# Patient Record
Sex: Female | Born: 1955
Health system: Southern US, Community
[De-identification: ages and names within clinical notes are randomized; demographics above are authoritative.]

## PROBLEM LIST (undated history)

## (undated) DIAGNOSIS — I011 Acute rheumatic endocarditis: Secondary | ICD-10-CM

## (undated) DIAGNOSIS — I2510 Atherosclerotic heart disease of native coronary artery without angina pectoris: Secondary | ICD-10-CM

## (undated) DIAGNOSIS — D649 Anemia, unspecified: Secondary | ICD-10-CM

## (undated) DIAGNOSIS — I10 Essential (primary) hypertension: Secondary | ICD-10-CM

## (undated) DIAGNOSIS — K219 Gastro-esophageal reflux disease without esophagitis: Secondary | ICD-10-CM

## (undated) DIAGNOSIS — K5730 Diverticulosis of large intestine without perforation or abscess without bleeding: Secondary | ICD-10-CM

## (undated) DIAGNOSIS — M7500 Adhesive capsulitis of unspecified shoulder: Secondary | ICD-10-CM

## (undated) DIAGNOSIS — I214 Non-ST elevation (NSTEMI) myocardial infarction: Secondary | ICD-10-CM

## (undated) DIAGNOSIS — E119 Type 2 diabetes mellitus without complications: Secondary | ICD-10-CM

## (undated) HISTORY — PX: WISDOM TOOTH EXTRACTION: SHX21

## (undated) HISTORY — PX: DILATION AND CURETTAGE OF UTERUS: SHX78

## (undated) HISTORY — DX: Type 2 diabetes mellitus without complications: E11.9

## (undated) HISTORY — DX: Gastro-esophageal reflux disease without esophagitis: K21.9

## (undated) HISTORY — DX: Acute rheumatic endocarditis: I01.1

## (undated) HISTORY — DX: Atherosclerotic heart disease of native coronary artery without angina pectoris: I25.10

## (undated) HISTORY — DX: Anemia, unspecified: D64.9

## (undated) HISTORY — DX: Adhesive capsulitis of unspecified shoulder: M75.00

## (undated) HISTORY — PX: SHOULDER SURGERY: SHX246

## (undated) HISTORY — DX: Essential (primary) hypertension: I10

## (undated) HISTORY — DX: Diverticulosis of large intestine without perforation or abscess without bleeding: K57.30

## (undated) HISTORY — DX: Non-ST elevation (NSTEMI) myocardial infarction: I21.4

---

## 2006-05-25 ENCOUNTER — Ambulatory Visit (HOSPITAL_BASED_OUTPATIENT_CLINIC_OR_DEPARTMENT_OTHER): Admission: RE | Admit: 2006-05-25 | Discharge: 2006-05-25 | Payer: Self-pay | Admitting: Orthopedic Surgery

## 2011-09-02 ENCOUNTER — Encounter: Payer: Self-pay | Admitting: Internal Medicine

## 2011-10-21 ENCOUNTER — Encounter: Payer: Self-pay | Admitting: Internal Medicine

## 2011-11-24 ENCOUNTER — Ambulatory Visit (AMBULATORY_SURGERY_CENTER): Payer: 59

## 2011-11-24 VITALS — Ht 68.0 in | Wt 162.3 lb

## 2011-11-24 DIAGNOSIS — Z1211 Encounter for screening for malignant neoplasm of colon: Secondary | ICD-10-CM

## 2011-11-24 MED ORDER — PEG-KCL-NACL-NASULF-NA ASC-C 100 G PO SOLR: 1.0000 | Freq: Once | ORAL | Status: AC

## 2011-11-25 ENCOUNTER — Encounter: Payer: Self-pay | Admitting: Internal Medicine

## 2011-12-08 ENCOUNTER — Encounter: Payer: Self-pay | Admitting: Internal Medicine

## 2011-12-08 ENCOUNTER — Ambulatory Visit (AMBULATORY_SURGERY_CENTER): Payer: 59 | Admitting: Internal Medicine

## 2011-12-08 VITALS — BP 116/77 | HR 82 | Temp 96.8°F | Resp 16 | Ht 68.0 in | Wt 162.0 lb

## 2011-12-08 DIAGNOSIS — Z1211 Encounter for screening for malignant neoplasm of colon: Secondary | ICD-10-CM

## 2011-12-08 DIAGNOSIS — K5730 Diverticulosis of large intestine without perforation or abscess without bleeding: Secondary | ICD-10-CM

## 2011-12-08 LAB — GLUCOSE, CAPILLARY: Glucose-Capillary: 196 mg/dL — ABNORMAL HIGH (ref 70–99)

## 2011-12-08 MED ORDER — SODIUM CHLORIDE 0.9 % IV SOLN: 500.0000 mL | INTRAVENOUS | Status: DC

## 2011-12-08 NOTE — Progress Notes (Signed)
Patient did not experience any of the following events: a burn prior to discharge; a fall within the facility; wrong site/side/patient/procedure/implant event; or a hospital transfer or hospital admission upon discharge from the facility. (G8907) Patient did not have preoperative order for IV antibiotic SSI prophylaxis. (G8918)  

## 2011-12-08 NOTE — Op Note (Signed)
[facility] [address] [place], [place]  [address]  COLONOSCOPY PROCEDURE REPORT  PATIENT:  Rhonda, Allen  MR#:  [id number] BIRTHDATE:  31-Oct-1956, 55 yrs. old  GENDER:  female ENDOSCOPIST:  Wilhemina Bonito. Eda Keys, MD REF. BY:  Screening Recall PROCEDURE DATE:  12/08/2011 PROCEDURE:  Average-risk screening colonoscopy G0121 ASA CLASS:  Class II INDICATIONS:  colorectal cancer screening, average risk ; index exam 08-2004 w/ diverticulosis MEDICATIONS:   Fentanyl 75 mcg IV, Versed 7 mg IV, These medications were titrated to patient response per physician's verbal order  DESCRIPTION OF PROCEDURE:   After the risks benefits and alternatives of the procedure were thoroughly explained, informed consent was obtained.  Digital rectal exam was performed and revealed no abnormalities.   The LB 180AL [id number] endoscope was introduced through the anus and advanced to the cecum, which was identified by both the appendix and ileocecal valve, without limitations.  The quality of the prep was excellent, using MoviPrep.  The instrument was then slowly withdrawn as the colon was fully examined. <<PROCEDUREIMAGES>>  FINDINGS:  Moderate diverticulosis was found in the sigmoid colon. Otherwise normal colonoscopy without polyps, masses, vascular ectasias, or inflammatory changes.   Retroflexed views in the rectum revealed internal hemorrhoids.    The time to cecum = 3:40 minutes. The scope was then withdrawn in 9:32  minutes from the cecum and the procedure completed.  COMPLICATIONS:  None  ENDOSCOPIC IMPRESSION: 1) Moderate diverticulosis in the sigmoid colon 2) Otherwise normal colonoscopy 3) Internal hemorrhoids  RECOMMENDATIONS: 1) Continue current colorectal screening recommendations for "routine risk" patients with a repeat colonoscopy in 10 years.  ______________________________ Wilhemina Bonito. Eda Keys, MD  CC:  Creola Corn, MD;  The Patient  n. eSIGNED:   Wilhemina Bonito. Eda Keys  at 12/08/2011 08:40 AM  Mickel Duhamel, [phone]

## 2011-12-09 ENCOUNTER — Telehealth: Payer: Self-pay | Admitting: *Deleted

## 2011-12-09 NOTE — Telephone Encounter (Signed)

## 2014-10-15 ENCOUNTER — Encounter: Payer: Self-pay | Admitting: Internal Medicine

## 2014-12-18 ENCOUNTER — Encounter: Payer: Self-pay | Admitting: Internal Medicine

## 2014-12-18 ENCOUNTER — Ambulatory Visit (INDEPENDENT_AMBULATORY_CARE_PROVIDER_SITE_OTHER): Payer: 59 | Admitting: Internal Medicine

## 2014-12-18 VITALS — BP 110/60 | HR 89 | Ht 68.5 in | Wt 153.0 lb

## 2014-12-18 DIAGNOSIS — K2100 Gastro-esophageal reflux disease with esophagitis, without bleeding: Secondary | ICD-10-CM

## 2014-12-18 DIAGNOSIS — K5730 Diverticulosis of large intestine without perforation or abscess without bleeding: Secondary | ICD-10-CM

## 2014-12-18 DIAGNOSIS — K222 Esophageal obstruction: Secondary | ICD-10-CM

## 2014-12-18 MED ORDER — OMEPRAZOLE 40 MG PO CPDR: 40.0000 mg | DELAYED_RELEASE_CAPSULE | Freq: Every day | ORAL | Status: DC

## 2014-12-18 NOTE — Patient Instructions (Signed)
We have sent the following medications to your pharmacy for you to pick up at your convenience:  Omeprazole  

## 2014-12-18 NOTE — Progress Notes (Signed)
HISTORY OF PRESENT ILLNESS:  Rhonda Allen is a 58 y.o. female with past medical history as listed below. She is followed in this office for chronic GERD and surveillance colonoscopy. She was last evaluated December 2012 for routine screening colonoscopy. Index exam in 2005 was negative except for diverticulosis. Follow-up examination in 2012 again revealed moderate sigmoid diverticulosis and internal hemorrhoids. No polyps. Follow-up in 10 years recommended. Her index upper endoscopy was performed September 2005 to evaluate iron deficiency anemia. She was found to have mild reflux esophagitis, peptic stricture, antral erosions, and a hiatal hernia. At that time she was placed on omeprazole 20 mg daily. She has done well without reflux symptoms until about 3 months ago when she began to develop breakthrough pyrosis. She takes 20 mg of omeprazole each morning about 15 minutes before the first meal. She denies change in weight, dysphagia, or abdominal pain. GI review of systems is otherwise negative  REVIEW OF SYSTEMS:  All non-GI ROS negative except for joint aches  Past Medical History  Diagnosis Date  . Diabetes mellitus   . Hypertension   . GERD (gastroesophageal reflux disease)   . Chronic kidney disease   . Anemia   . Frozen shoulder     right  . Diverticulosis of colon   . Hemorrhoids     Past Surgical History  Procedure Laterality Date  . Shoulder surgery      bone spur left shoulder  . Wisdom tooth extraction    . Dilation and curettage of uterus      Social History Rhonda Allen  reports that she has never smoked. She has never used smokeless tobacco. She reports that she does not drink alcohol or use illicit drugs.  family history includes Irritable bowel syndrome in her mother.  No Known Allergies     PHYSICAL EXAMINATION: Vital signs: BP 110/60 mmHg  Pulse 89  Ht 5' 8.5" (1.74 m)  Wt 153 lb (69.4 kg)  BMI 22.92 kg/m2  SpO2 87% General:  Well-developed, well-nourished, no acute distress HEENT: Sclerae are anicteric, conjunctiva pink. Oral mucosa intact Lungs: Clear Heart: Regular Abdomen: soft, nontender, nondistended, no obvious ascites, no peritoneal signs, normal bowel sounds. No organomegaly. Extremities: No edema Psychiatric: alert and oriented x3. Cooperative     ASSESSMENT:  #1. GERD. Experiencing breakthrough symptoms on omeprazole 20 mg daily. No alarm features. Previous upper endoscopy in 2005 as described #2. Previous screening colonoscopies negative for neoplasia  PLAN:  #1. Reflux precautions #2. Change omeprazole to 40 mg daily. Prescription submitted electronically with multiple refills #3. Repeat screening colonoscopy around 2022 #4. Routine GI follow-up in 1 year. Sooner if needed for persistent or other relevant symptoms

## 2015-07-22 ENCOUNTER — Encounter: Payer: Self-pay | Admitting: Internal Medicine

## 2015-12-25 ENCOUNTER — Other Ambulatory Visit: Payer: Self-pay | Admitting: Internal Medicine

## 2015-12-29 DIAGNOSIS — I214 Non-ST elevation (NSTEMI) myocardial infarction: Secondary | ICD-10-CM

## 2015-12-29 HISTORY — DX: Non-ST elevation (NSTEMI) myocardial infarction: I21.4

## 2016-03-12 ENCOUNTER — Other Ambulatory Visit: Payer: Self-pay | Admitting: Internal Medicine

## 2016-08-14 ENCOUNTER — Inpatient Hospital Stay (HOSPITAL_COMMUNITY)
Admission: EM | Admit: 2016-08-14 | Discharge: 2016-08-18 | DRG: 247 | Disposition: A | Discharge status: Home / Self Care | Payer: 59 | Attending: Cardiology | Admitting: Cardiology

## 2016-08-14 ENCOUNTER — Encounter (HOSPITAL_COMMUNITY): Payer: Self-pay

## 2016-08-14 ENCOUNTER — Emergency Department (HOSPITAL_COMMUNITY): Payer: 59

## 2016-08-14 DIAGNOSIS — Z7982 Long term (current) use of aspirin: Secondary | ICD-10-CM | POA: Diagnosis not present

## 2016-08-14 DIAGNOSIS — E119 Type 2 diabetes mellitus without complications: Secondary | ICD-10-CM

## 2016-08-14 DIAGNOSIS — R079 Chest pain, unspecified: Secondary | ICD-10-CM | POA: Diagnosis present

## 2016-08-14 DIAGNOSIS — I214 Non-ST elevation (NSTEMI) myocardial infarction: Secondary | ICD-10-CM | POA: Diagnosis not present

## 2016-08-14 DIAGNOSIS — Z79899 Other long term (current) drug therapy: Secondary | ICD-10-CM | POA: Diagnosis not present

## 2016-08-14 DIAGNOSIS — I255 Ischemic cardiomyopathy: Secondary | ICD-10-CM | POA: Diagnosis not present

## 2016-08-14 DIAGNOSIS — K219 Gastro-esophageal reflux disease without esophagitis: Secondary | ICD-10-CM | POA: Diagnosis present

## 2016-08-14 DIAGNOSIS — Z91048 Other nonmedicinal substance allergy status: Secondary | ICD-10-CM | POA: Diagnosis not present

## 2016-08-14 DIAGNOSIS — I10 Essential (primary) hypertension: Secondary | ICD-10-CM | POA: Diagnosis present

## 2016-08-14 DIAGNOSIS — Z794 Long term (current) use of insulin: Secondary | ICD-10-CM

## 2016-08-14 DIAGNOSIS — E785 Hyperlipidemia, unspecified: Secondary | ICD-10-CM | POA: Diagnosis present

## 2016-08-14 DIAGNOSIS — I2510 Atherosclerotic heart disease of native coronary artery without angina pectoris: Secondary | ICD-10-CM | POA: Diagnosis present

## 2016-08-14 LAB — COMPREHENSIVE METABOLIC PANEL
ALBUMIN: 4.1 g/dL (ref 3.5–5.0)
ALT: 23 U/L (ref 14–54)
ANION GAP: 8 (ref 5–15)
AST: 31 U/L (ref 15–41)
Alkaline Phosphatase: 68 U/L (ref 38–126)
BUN: 16 mg/dL (ref 6–20)
CHLORIDE: 104 mmol/L (ref 101–111)
CO2: 28 mmol/L (ref 22–32)
Calcium: 9.1 mg/dL (ref 8.9–10.3)
Creatinine, Ser: 0.86 mg/dL (ref 0.44–1.00)
GFR calc non Af Amer: >60 mL/min (ref >60)
GLUCOSE: 88 mg/dL (ref 65–99)
Potassium: 3.6 mmol/L (ref 3.5–5.1)
SODIUM: 140 mmol/L (ref 135–145)
Total Bilirubin: 0.9 mg/dL (ref 0.3–1.2)
Total Protein: 7.2 g/dL (ref 6.5–8.1)

## 2016-08-14 LAB — CBC
HCT: 38.8 % (ref 36.0–46.0)
HEMOGLOBIN: 13.2 g/dL (ref 12.0–15.0)
MCH: 32.3 pg (ref 26.0–34.0)
MCHC: 34 g/dL (ref 30.0–36.0)
MCV: 94.9 fL (ref 78.0–100.0)
PLATELETS: 205 10*3/uL (ref 150–400)
RBC: 4.09 MIL/uL (ref 3.87–5.11)
RDW: 12.9 % (ref 11.5–15.5)
WBC: 5.6 10*3/uL (ref 4.0–10.5)

## 2016-08-14 LAB — TROPONIN I
Troponin I: 1.68 ng/mL (ref <0.03)
Troponin I: 1.48 ng/mL (ref <0.03)

## 2016-08-14 MED ORDER — ACETAMINOPHEN 325 MG PO TABS
650.0000 mg | ORAL_TABLET | ORAL | Status: DC | PRN
  Administered 2016-08-16: 650 mg via ORAL
  Filled 2016-08-14: qty 2

## 2016-08-14 MED ORDER — SODIUM CHLORIDE 0.9% FLUSH
3.0000 mL | Freq: Two times a day (BID) | INTRAVENOUS | Status: DC
  Administered 2016-08-15 – 2016-08-16 (×3): 3 mL via INTRAVENOUS

## 2016-08-14 MED ORDER — INSULIN ASPART 100 UNIT/ML ~~LOC~~ SOLN
0.0000 [IU] | Freq: Three times a day (TID) | SUBCUTANEOUS | Status: DC
  Administered 2016-08-15: 11 [IU] via SUBCUTANEOUS
  Administered 2016-08-15 – 2016-08-16 (×2): 8 [IU] via SUBCUTANEOUS
  Administered 2016-08-16: 3 [IU] via SUBCUTANEOUS
  Administered 2016-08-16: 5 [IU] via SUBCUTANEOUS
  Administered 2016-08-18: 07:00:00 2 [IU] via SUBCUTANEOUS
  Administered 2016-08-18: 13:00:00 8 [IU] via SUBCUTANEOUS

## 2016-08-14 MED ORDER — SODIUM CHLORIDE 0.9 % IV SOLN
250.0000 mL | INTRAVENOUS | Status: DC | PRN
  Administered 2016-08-17: 1000 mL via INTRAVENOUS

## 2016-08-14 MED ORDER — NITROGLYCERIN 0.4 MG SL SUBL
0.4000 mg | SUBLINGUAL_TABLET | SUBLINGUAL | Status: DC | PRN
  Administered 2016-08-16 (×2): 0.4 mg via SUBLINGUAL
  Filled 2016-08-14 (×2): qty 1

## 2016-08-14 MED ORDER — ASPIRIN EC 81 MG PO TBEC
81.0000 mg | DELAYED_RELEASE_TABLET | Freq: Every day | ORAL | Status: DC
  Administered 2016-08-15 – 2016-08-18 (×3): 81 mg via ORAL
  Filled 2016-08-14 (×3): qty 1

## 2016-08-14 MED ORDER — FERROUS SULFATE 325 (65 FE) MG PO TABS
325.0000 mg | ORAL_TABLET | ORAL | Status: DC
  Administered 2016-08-17: 325 mg via ORAL
  Filled 2016-08-14 (×2): qty 1

## 2016-08-14 MED ORDER — HEPARIN (PORCINE) IN NACL 100-0.45 UNIT/ML-% IJ SOLN
800.0000 [IU]/h | INTRAMUSCULAR | Status: DC
  Administered 2016-08-14 – 2016-08-17 (×3): 800 [IU]/h via INTRAVENOUS
  Filled 2016-08-14 (×3): qty 250

## 2016-08-14 MED ORDER — PANTOPRAZOLE SODIUM 40 MG PO TBEC
80.0000 mg | DELAYED_RELEASE_TABLET | Freq: Every day | ORAL | Status: DC
  Administered 2016-08-15 – 2016-08-18 (×4): 80 mg via ORAL
  Filled 2016-08-14 (×4): qty 2

## 2016-08-14 MED ORDER — HEPARIN BOLUS VIA INFUSION
4000.0000 [IU] | Freq: Once | INTRAVENOUS | Status: AC
  Administered 2016-08-14: 4000 [IU] via INTRAVENOUS
  Filled 2016-08-14: qty 4000

## 2016-08-14 MED ORDER — OMEGA-3-ACID ETHYL ESTERS 1 G PO CAPS
1.0000 g | ORAL_CAPSULE | Freq: Every day | ORAL | Status: DC
  Administered 2016-08-15 – 2016-08-18 (×4): 1 g via ORAL
  Filled 2016-08-14 (×4): qty 1

## 2016-08-14 MED ORDER — ASPIRIN 81 MG PO CHEW: 81.0000 mg | CHEWABLE_TABLET | ORAL | Status: AC

## 2016-08-14 MED ORDER — LISINOPRIL 10 MG PO TABS
10.0000 mg | ORAL_TABLET | Freq: Every day | ORAL | Status: DC
  Administered 2016-08-15 – 2016-08-18 (×4): 10 mg via ORAL
  Filled 2016-08-14 (×4): qty 1

## 2016-08-14 MED ORDER — ONDANSETRON HCL 4 MG/2ML IJ SOLN: 4.0000 mg | Freq: Four times a day (QID) | INTRAMUSCULAR | Status: DC | PRN

## 2016-08-14 MED ORDER — SODIUM CHLORIDE 0.9% FLUSH: 3.0000 mL | INTRAVENOUS | Status: DC | PRN

## 2016-08-14 MED ORDER — ADULT MULTIVITAMIN W/MINERALS CH
1.0000 | ORAL_TABLET | ORAL | Status: DC
  Administered 2016-08-17: 15:00:00 1 via ORAL
  Filled 2016-08-14 (×2): qty 1

## 2016-08-14 MED ORDER — SODIUM CHLORIDE 0.9 % IV SOLN
INTRAVENOUS | Status: DC
  Administered 2016-08-14 – 2016-08-16 (×7): via INTRAVENOUS

## 2016-08-14 MED ORDER — INSULIN GLARGINE 100 UNIT/ML ~~LOC~~ SOLN
18.0000 [IU] | Freq: Every day | SUBCUTANEOUS | Status: DC
  Administered 2016-08-15 – 2016-08-17 (×4): 18 [IU] via SUBCUTANEOUS
  Filled 2016-08-14 (×6): qty 0.18

## 2016-08-14 MED ORDER — CALCIUM POLYCARBOPHIL 625 MG PO TABS
625.0000 mg | ORAL_TABLET | Freq: Every day | ORAL | Status: DC
  Administered 2016-08-15 – 2016-08-18 (×3): 625 mg via ORAL
  Filled 2016-08-14 (×4): qty 1

## 2016-08-14 NOTE — H&P (Signed)
History and Physical   Admit date: 08/14/2016 Name:  Rhonda Allen Medical record number: [id number] DOB/Age:  60/02/1956  60 y.o. female  Referring Physician:   [facility]  Primary Cardiologist:  Althia Forts  Primary Physician:   Dr. Shon Baton  Chief complaint/reason for admission: Chest pain  HPI:  This very nice 60 year old female has a 9 year history of diabetes mellitus.  She is under reasonably good control and has no diabetic complications.  She was in her usual state of health until Monday when she developed substernal chest discomfort when walking.  She has had several episodes since then both with exercise as well as at rest and had a fairly prolonged episode this morning occurring at rest.  She was seen at her primary care doctor's office this afternoon with new EKG changes and was sent to the emergency room.  Prior to these episodes she has not had any previous issues with her heart.  She was told that she might have mitral valve prolapse in the past.  She normally denies PND, orthopnea or edema.  She has not been treated with a statin drug in the past but does take aspirin.  There is no family history of cardiac disease.   Past Medical History:  Diagnosis Date  . Anemia   . Diabetes mellitus   . Diverticulosis of colon   . Frozen shoulder    right  . GERD (gastroesophageal reflux disease)   . Hemorrhoids   . Hypertension      Past Surgical History:  Procedure Laterality Date  . DILATION AND CURETTAGE OF UTERUS    . SHOULDER SURGERY     bone spur left shoulder  . WISDOM TOOTH EXTRACTION     Allergies: has No Known Allergies.   Medications: Prior to Admission medications   Medication Sig Start Date End Date Taking? Authorizing Provider  ACCU-CHEK AVIVA PLUS test strip  11/17/11  Yes Historical Provider, MD  aspirin 81 MG tablet Take 81 mg by mouth daily.     Yes Historical Provider, MD  B-D ULTRAFINE III SHORT PEN 31G X 8 MM MISC  11/30/11  Yes  Historical Provider, MD  BD INSULIN SYRINGE ULTRAFINE 31G X 5/16" 0.3 ML MISC  12/02/11  Yes Historical Provider, MD  Ferrous Sulfate (IRON) 325 (65 FE) MG TABS Take 1 tablet by mouth every Monday, Wednesday, and Friday.    Yes Historical Provider, MD  insulin glargine (LANTUS) 100 UNIT/ML injection Inject 18-19 Units into the skin at bedtime.    Yes Historical Provider, MD  insulin lispro (HUMALOG) 100 UNIT/ML injection Inject 8-15 Units into the skin 3 (three) times daily before meals. Per sliding scale   Yes Historical Provider, MD  lisinopril (PRINIVIL,ZESTRIL) 10 MG tablet Take 10 mg by mouth daily.     Yes Historical Provider, MD  Multiple Vitamin (MULTIVITAMIN) tablet Take 1 tablet by mouth every Monday, Wednesday, and Friday.    Yes Historical Provider, MD  Omega-3 Fatty Acids (FISH OIL PO) Take 1 capsule by mouth See admin instructions. Once to twice daily   Yes Historical Provider, MD  omeprazole (PRILOSEC) 40 MG capsule TAKE ONE CAPSULE BY MOUTH DAILY 03/13/16  Yes Irene Shipper, MD  polycarbophil (FIBERCON) 625 MG tablet Take 625 mg by mouth daily.   Yes Historical Provider, MD   Family History:  Family Status  Relation Status  . Mother Alive  . Father Deceased -Died of brain cancer     Social History:  reports that she has never smoked. She has never used smokeless tobacco. She reports that she does not drink alcohol or use drugs.   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  3 children.   Review of Systems: No retinopathy or neuropathy.  Has history of reflux but no recent symptoms.  Weight has been stable. Other than as noted above, the remainder of the review of systems is normal  Physical Exam: BP 126/63 (BP Location: Right Arm)   Pulse 81   Temp 97.9 F (36.6 C) (Oral)   Resp 18   Ht 5\' 8"  (1.727 m)   Wt 72.6 kg (160 lb)   SpO2 99%   BMI 24.33 kg/m   General appearance: Pleasant white female currently in no acute  distress Head: Normocephalic, without obvious abnormality, atraumatic Eyes: conjunctivae/corneas clear. PERRL, EOM's intact. Fundi not examined  Neck: no adenopathy, no carotid bruit, no JVD and supple, symmetrical, trachea midline Lungs: clear to auscultation bilaterally Heart: regular rate and rhythm, S1, S2 normal, no murmur, click, rub or gallop Abdomen: soft, non-tender; bowel sounds normal; no masses,  no organomegaly Pelvic: deferred Extremities: extremities normal, atraumatic, no cyanosis or edema Pulses: 2+ and symmetric Skin: Skin color, texture, turgor normal. No rashes or lesions Neurologic: Grossly normal  Labs: CBC  Recent Labs  08/14/16 1659  WBC 5.6  RBC 4.09  HGB 13.2  HCT 38.8  PLT 205  MCV 94.9  MCH 32.3  MCHC 34.0  RDW 12.9   CMP   Recent Labs  08/14/16 1659  NA 140  K 3.6  CL 104  CO2 28  GLUCOSE 88  BUN 16  CREATININE 0.86  CALCIUM 9.1  PROT 7.2  ALBUMIN 4.1  AST 31  ALT 23  ALKPHOS 68  BILITOT 0.9  GFRNONAA >60  GFRAA >60    Cardiac Panel (last 3 results)  Recent Labs  08/14/16 1659  TROPONINI 1.68*    EKG: Q wave in V2 and V3, lateral ST depression  Radiology: Abnormal chest x-ray with biapical scarring and distortion   IMPRESSIONS: 1.  Acute coronary syndrome with non-STEMI  2.  Insulin-dependent diabetes mellitus   PLAN: Admit to telemetry.  Intravenous heparin.  Cycle cardiac enzymes.  Beta blocker, statin drug, if recurrent pain consider early cath if cannot settle down.  Cardiac catheterization was discussed with the patient fully including risks of myocardial infarction, death, stroke, bleeding, arrhythmia, dye allergy, renal insufficiency or bleeding.  The patient understands and is willing to proceed.  Possibility of intervention at the same time also discussed with patient and she understand and are agreeable to proceed  Signed: W. Doristine Church MD [facility]  08/14/2016, 7:57 PM

## 2016-08-14 NOTE — ED Provider Notes (Signed)
[facility] DEPT Provider Note   CSN: [id number] Arrival date & time: 08/14/16  1644     History   Chief Complaint Chief Complaint  Patient presents with  . Chest Pain    HPI Rhonda Allen is a 60 y.o. female.  60 yo F w/ intermittent left/mid chest pressure without radiation, sob, vomiting, dizziness or other associated symptoms has happened 3 times with activity, once at rest. Improves after about 5 minutes. Seems to feel better with clutching fist to chest. Before this week, never had this before. Went to PCP was found to have new depressions and inverted t waves so sent here. Pain free at this time. Symptom free as well.       Past Medical History:  Diagnosis Date  . Anemia   . Chronic kidney disease   . Diabetes mellitus   . Diverticulosis of colon   . Frozen shoulder    right  . GERD (gastroesophageal reflux disease)   . Hemorrhoids   . Hypertension     There are no active problems to display for this patient.   Past Surgical History:  Procedure Laterality Date  . DILATION AND CURETTAGE OF UTERUS    . SHOULDER SURGERY     bone spur left shoulder  . WISDOM TOOTH EXTRACTION      OB History    No data available       Home Medications    Prior to Admission medications   Medication Sig Start Date End Date Taking? Authorizing Provider  ACCU-CHEK AVIVA PLUS test strip  11/17/11   Historical Provider, MD  aspirin 81 MG tablet Take 81 mg by mouth daily.      Historical Provider, MD  B-D ULTRAFINE III SHORT PEN 31G X 8 MM MISC  11/30/11   Historical Provider, MD  BD INSULIN SYRINGE ULTRAFINE 31G X 5/16" 0.3 ML MISC  12/02/11   Historical Provider, MD  Ferrous Sulfate (IRON) 325 (65 FE) MG TABS Take by mouth. Take 3 times a week     Historical Provider, MD  insulin glargine (LANTUS) 100 UNIT/ML injection Inject 19 Units into the skin at bedtime.      Historical Provider, MD  insulin lispro (HUMALOG) 100 UNIT/ML injection Inject into the skin 3  (three) times daily before meals. Humalog Insulin /sliding scale    Take 3-4 times a day prn     Historical Provider, MD  lisinopril (PRINIVIL,ZESTRIL) 10 MG tablet Take 10 mg by mouth daily.      Historical Provider, MD  Multiple Vitamin (MULTIVITAMIN) tablet Take 1 tablet by mouth daily.      Historical Provider, MD  omeprazole (PRILOSEC) 40 MG capsule TAKE ONE CAPSULE BY MOUTH DAILY 03/13/16   Irene Shipper, MD    Family History Family History  Problem Relation Age of Onset  . Irritable bowel syndrome Mother     Social History Social History  Substance Use Topics  . Smoking status: Never Smoker  . Smokeless tobacco: Never Used  . Alcohol use No     Allergies   Review of patient's allergies indicates no known allergies.   Review of Systems Review of Systems  Respiratory: Positive for chest tightness.   Cardiovascular: Positive for chest pain.  All other systems reviewed and are negative.    Physical Exam Updated Vital Signs Ht 5\' 8"  (1.727 m)   Wt 160 lb (72.6 kg)   SpO2 97% Comment: RA  BMI 24.33 kg/m   Physical Exam  Constitutional: She is oriented to person, place, and time. She appears well-developed and well-nourished. No distress.  HENT:  Head: Normocephalic and atraumatic.  Eyes: Conjunctivae are normal.  Neck: Neck supple.  Cardiovascular: Normal rate and regular rhythm.   No murmur heard. Pulmonary/Chest: Effort normal and breath sounds normal. No respiratory distress.  Abdominal: Soft. There is no tenderness.  Musculoskeletal: She exhibits no edema.  Neurological: She is alert and oriented to person, place, and time. No cranial nerve deficit.  Skin: Skin is warm and dry.  Psychiatric: She has a normal mood and affect.  Nursing note and vitals reviewed.    ED Treatments / Results  Labs (all labs ordered are listed, but only abnormal results are displayed) Labs Reviewed - No data to display  EKG  EKG Interpretation  Date/Time:  Friday August 14 2016 17:13:47 EDT Ventricular Rate:  82 PR Interval:    QRS Duration: 86 QT Interval:  321 QTC Calculation: 375 R Axis:   97 Text Interpretation:  Sinus rhythm Probable anterior infarct, age indeterminate Baseline wander in lead(s) V1 Confirmed by [facility] MD, Rheya Minogue [phone]) on 08/14/2016 5:17:50 PM       Radiology Dg Chest 2 View  Result Date: 08/14/2016 CLINICAL DATA:  Initial evaluation for intermittent chest pain for 1 week. EXAM: CHEST  2 VIEW COMPARISON:  None. FINDINGS: The cardiac and mediastinal silhouettes are within normal limits. Lungs are normally inflated. Irregular biapical pleural parenchymal scarring with architectural distortion. No focal infiltrates. No pulmonary edema or pleural effusion. No pneumothorax. No acute osseous abnormality. IMPRESSION: 1. Chronic biapical pleural parenchymal scarring/architectural distortion. 2. No other active cardiopulmonary disease. Electronically Signed   By: Jeannine Boga M.D.   On: 08/14/2016 17:58    Procedures Procedures (including critical care time)  CRITICAL CARE Performed by: Merrily Pew Total critical care time: 35 minutes Critical care time was exclusive of separately billable procedures and treating other patients. Critical care was necessary to treat or prevent imminent or life-threatening deterioration. Critical care was time spent personally by me on the following activities: development of treatment plan with patient and/or surrogate as well as nursing, discussions with consultants, evaluation of patient's response to treatment, examination of patient, obtaining history from patient or surrogate, ordering and performing treatments and interventions, ordering and review of laboratory studies, ordering and review of radiographic studies, pulse oximetry and re-evaluation of patient's condition.   Medications Ordered in ED Medications  nitroGLYCERIN (NITROSTAT) SL tablet 0.4 mg (not administered)  0.9 %  sodium  chloride infusion ( Intravenous Transfusing/Transfer 08/15/16 0020)  heparin ADULT infusion 100 units/mL (25000 units/264mL sodium chloride 0.45%) (800 Units/hr Intravenous Transfusing/Transfer 08/15/16 0019)  omega-3 acid ethyl esters (LOVAZA) capsule 1 g (not administered)  polycarbophil (FIBERCON) tablet 625 mg (not administered)  pantoprazole (PROTONIX) EC tablet 80 mg (not administered)  aspirin EC tablet 81 mg (not administered)  insulin glargine (LANTUS) injection 18 Units (not administered)  ferrous sulfate tablet 325 mg (not administered)  lisinopril (PRINIVIL,ZESTRIL) tablet 10 mg (not administered)  multivitamin with minerals tablet 1 tablet (not administered)  acetaminophen (TYLENOL) tablet 650 mg (not administered)  ondansetron (ZOFRAN) injection 4 mg (not administered)  insulin aspart (novoLOG) injection 0-15 Units (not administered)  sodium chloride flush (NS) 0.9 % injection 3 mL (3 mLs Intravenous Not Given 08/14/16 2305)  sodium chloride flush (NS) 0.9 % injection 3 mL (not administered)  0.9 %  sodium chloride infusion (not administered)  aspirin chewable tablet 81 mg (not administered)  heparin bolus  via infusion 4,000 Units (4,000 Units Intravenous Bolus from Bag 08/14/16 1858)     Initial Impression / Assessment and Plan / ED Course  I have reviewed the triage vital signs and the nursing notes.  Pertinent labs & imaging results that were available during my care of the patient were reviewed by me and considered in my medical decision making (see chart for details).  Concern for Unstable angina. No RF for PE. Will plan for initial labs and ecg with medicine admission if all ok for stress in AM.   Clinical Course   Positive troponin, NSTEMI. Plan for heparin/ASA and cardiology admission.   Final Clinical Impressions(s) / ED Diagnoses   Final diagnoses:  NSTEMI (non-ST elevated myocardial infarction) [facility])     Merrily Pew, MD 08/15/16 0041

## 2016-08-14 NOTE — Progress Notes (Signed)
ANTICOAGULATION CONSULT NOTE - Initial Consult  Pharmacy Consult for heparin Indication: chest pain/ACS  No Known Allergies  Patient Measurements: Height: 5\' 8"  (172.7 cm) Weight: 160 lb (72.6 kg) IBW/kg (Calculated) : 63.9 Heparin Dosing Weight: 72.6kg  Vital Signs: BP: 127/76 (08/18 1815) Pulse Rate: 82 (08/18 1815)  Labs:  Recent Labs  08/14/16 1659  HGB 13.2  HCT 38.8  PLT 205  CREATININE 0.86  TROPONINI 1.68*    Estimated Creatinine Clearance: 70.2 mL/min (by C-G formula based on SCr of 0.86 mg/dL).   Assessment: 31 YOF here with intermittent chest pressure since Monday, during rest and activity. PCP did EKG and saw new depressions and inverted t-waves, so sent to ED. To start IV heparin for ACS/NSTEMI. Not on anticoagulation PTA.  Baseline Hgb 13.2, plts 205- no bleeding noted. Troponin positive at 1.68.   Goal of Therapy:  Heparin level 0.3-0.7 units/ml Monitor platelets by anticoagulation protocol: Yes   Plan:  -bolus with heparin 4000 units IV x1, then start infusion at 800 units/hr -heparin level in 6h -daily HL and CBC -follow cardiology plans  Sammy Douthitt D. Jeremaih Klima, PharmD, BCPS Clinical Pharmacist Pager: [phone] 08/14/2016 6:47 PM

## 2016-08-14 NOTE — ED Notes (Signed)
MD at bedside. 

## 2016-08-14 NOTE — ED Notes (Addendum)
Attempted report to 2W °

## 2016-08-14 NOTE — ED Triage Notes (Signed)
Pt BIB [facility] for evaluation of intermittent central chest pressure since Monday. Pt. Denies associated symptoms. Pt. States has had episodes occurring during both activity and rest so is unable to associate pain with activity. Pt. Went to PCP today and was sent her regarding EKG changes, PCP reports new EKG showing inverted T waves and depression in leads 4,5,6. Pt. Denies pain at this time. Pt. Given 324 ASA by PCP.

## 2016-08-15 ENCOUNTER — Inpatient Hospital Stay (HOSPITAL_COMMUNITY): Payer: 59

## 2016-08-15 DIAGNOSIS — I214 Non-ST elevation (NSTEMI) myocardial infarction: Principal | ICD-10-CM

## 2016-08-15 DIAGNOSIS — R079 Chest pain, unspecified: Secondary | ICD-10-CM

## 2016-08-15 LAB — LIPID PANEL
CHOL/HDL RATIO: 2.7 ratio
CHOLESTEROL: 216 mg/dL — AB (ref 0–200)
HDL: 80 mg/dL (ref >40)
LDL Cholesterol: 128 mg/dL — ABNORMAL HIGH (ref 0–99)
Triglycerides: 40 mg/dL (ref <150)
VLDL: 8 mg/dL (ref 0–40)

## 2016-08-15 LAB — ECHOCARDIOGRAM COMPLETE
HEIGHTINCHES: 68 in
Weight: 2525.59 oz

## 2016-08-15 LAB — BASIC METABOLIC PANEL
ANION GAP: 6 (ref 5–15)
BUN: 12 mg/dL (ref 6–20)
CO2: 25 mmol/L (ref 22–32)
Calcium: 8.7 mg/dL — ABNORMAL LOW (ref 8.9–10.3)
Chloride: 110 mmol/L (ref 101–111)
Creatinine, Ser: 0.75 mg/dL (ref 0.44–1.00)
GFR calc Af Amer: >60 mL/min (ref >60)
Glucose, Bld: 136 mg/dL — ABNORMAL HIGH (ref 65–99)
POTASSIUM: 3.9 mmol/L (ref 3.5–5.1)
SODIUM: 141 mmol/L (ref 135–145)

## 2016-08-15 LAB — CBC
HCT: 39.2 % (ref 36.0–46.0)
HEMOGLOBIN: 12.7 g/dL (ref 12.0–15.0)
MCH: 31.1 pg (ref 26.0–34.0)
MCHC: 32.4 g/dL (ref 30.0–36.0)
MCV: 96.1 fL (ref 78.0–100.0)
PLATELETS: 174 10*3/uL (ref 150–400)
RBC: 4.08 MIL/uL (ref 3.87–5.11)
RDW: 12.9 % (ref 11.5–15.5)
WBC: 5.3 10*3/uL (ref 4.0–10.5)

## 2016-08-15 LAB — GLUCOSE, CAPILLARY
GLUCOSE-CAPILLARY: 165 mg/dL — AB (ref 65–99)
GLUCOSE-CAPILLARY: 83 mg/dL (ref 65–99)
Glucose-Capillary: 329 mg/dL — ABNORMAL HIGH (ref 65–99)
Glucose-Capillary: 279 mg/dL — ABNORMAL HIGH (ref 65–99)

## 2016-08-15 LAB — TROPONIN I
Troponin I: 1.28 ng/mL (ref <0.03)
Troponin I: 1.46 ng/mL (ref <0.03)

## 2016-08-15 LAB — HEPARIN LEVEL (UNFRACTIONATED)
HEPARIN UNFRACTIONATED: 0.61 [IU]/mL (ref 0.30–0.70)
HEPARIN UNFRACTIONATED: 0.59 [IU]/mL (ref 0.30–0.70)

## 2016-08-15 MED ORDER — ATORVASTATIN CALCIUM 80 MG PO TABS
80.0000 mg | ORAL_TABLET | Freq: Every day | ORAL | Status: DC
  Administered 2016-08-15 – 2016-08-17 (×3): 80 mg via ORAL
  Filled 2016-08-15 (×3): qty 1

## 2016-08-15 NOTE — Progress Notes (Signed)
Patient lying in bed, husband present at bedside, no needs at this time. Call light within reach

## 2016-08-15 NOTE — Progress Notes (Signed)
Echocardiogram 2D Echocardiogram has been performed.  Rhonda Allen 08/15/2016, 10:13 AM

## 2016-08-15 NOTE — Progress Notes (Signed)
Primary cardiologist: Dr. Landry Corporal  Seen for followup: NSTEMI  Subjective:    No chest pain or shortness of breath.  Objective:   Temp:  [97.9 F (36.6 C)-98.3 F (36.8 C)] 98.3 F (36.8 C) (08/19 0611) Pulse Rate:  [70-96] 72 (08/19 0611) Resp:  [11-19] 18 (08/19 0611) BP: (111-149)/(63-90) 121/70 (08/19 0611) SpO2:  [95 %-100 %] 99 % (08/19 0611) FiO2 (%):  [0 %] 0 % (08/18 2305) Weight:  [157 lb 13.6 oz (71.6 kg)-160 lb (72.6 kg)] 157 lb 13.6 oz (71.6 kg) (08/19 0012) Last BM Date: 08/14/16  Filed Weights   08/14/16 1651 08/15/16 0012  Weight: 160 lb (72.6 kg) 157 lb 13.6 oz (71.6 kg)    Intake/Output Summary (Last 24 hours) at 08/15/16 1348 Last data filed at 08/15/16 1136  Gross per 24 hour  Intake             1291 ml  Output             2150 ml  Net             -859 ml    Telemetry: Sinus rhythm.  Exam:  General: Appears comfortable at rest.  Lungs: Clear, nonlabored.  Cardiac: RRR without gallop.  Abdomen: NABS.  Extremities: No pitting edema.  Lab Results:  Basic Metabolic Panel:  Recent Labs Lab 08/14/16 1659 08/15/16 0433  NA 140 141  K 3.6 3.9  CL 104 110  CO2 28 25  GLUCOSE 88 136*  BUN 16 12  CREATININE 0.86 0.75  CALCIUM 9.1 8.7*    Liver Function Tests:  Recent Labs Lab 08/14/16 1659  AST 31  ALT 23  ALKPHOS 68  BILITOT 0.9  PROT 7.2  ALBUMIN 4.1    CBC:  Recent Labs Lab 08/14/16 1659 08/15/16 0433  WBC 5.6 5.3  HGB 13.2 12.7  HCT 38.8 39.2  MCV 94.9 96.1  PLT 205 174    Cardiac Enzymes:  Recent Labs Lab 08/14/16 2305 08/15/16 0433 08/15/16 0926  TROPONINI 1.48* 1.28* 1.46*    ECG: Tracing from 08/15/2016 shows sinus rhythm with poor progression, rule out anterior infarct pattern with anterolateral T-wave inversions and prolonged QTc.   Medications:   Scheduled Medications: . aspirin  81 mg Oral Pre-Cath  . aspirin EC  81 mg Oral Daily  . [START ON 08/17/2016] ferrous sulfate  325 mg  Oral Q M,W,F  . insulin aspart  0-15 Units Subcutaneous TID WC  . insulin glargine  18 Units Subcutaneous QHS  . lisinopril  10 mg Oral Daily  . [START ON 08/17/2016] multivitamin with minerals  1 tablet Oral Q M,W,F  . omega-3 acid ethyl esters  1 g Oral Daily  . pantoprazole  80 mg Oral Daily  . polycarbophil  625 mg Oral Q breakfast  . sodium chloride flush  3 mL Intravenous Q12H     Infusions: . sodium chloride 125 mL/hr at 08/15/16 1151  . heparin 800 Units/hr (08/14/16 1857)     PRN Medications:  sodium chloride, acetaminophen, nitroGLYCERIN, ondansetron (ZOFRAN) IV, sodium chloride flush   Assessment:   1. NSTEMI, peak troponin I 0.4 at this point. She is symptomatically stable on heparin. ECG shows poor R-wave progression rule out anterior infarct pattern with anterolateral T-wave inversions. Echocardiogram pending for assessment of LVEF and wall motion.  2. LDL 128, start high-dose statin in light of ACS.  3. Essential hypertension.  4. Type 2 diabetes mellitus.   Plan/Discussion:  Patient scheduled for cardiac catheterization as per Dr. Wynonia Lawman. Continue aspirin, lisinopril, diabetes regimen, add high-dose statin. She continues on heparin as well as. Follow-up lab work a.m.   Satira Sark, M.D., F.A.C.C.

## 2016-08-15 NOTE — Progress Notes (Signed)
ANTICOAGULATION CONSULT NOTE - Follow Up Consult  Pharmacy Consult for Heparin  Indication: chest pain/ACS  No Known Allergies  Patient Measurements: Height: 5\' 8"  (172.7 cm) Weight: 157 lb 13.6 oz (71.6 kg) IBW/kg (Calculated) : 63.9  Vital Signs: Temp: 98 F (36.7 C) (08/19 0012) Temp Source: Oral (08/19 0012) BP: 149/90 (08/19 0012) Pulse Rate: 95 (08/19 0012)  Labs:  Recent Labs  08/14/16 1659 08/14/16 2305 08/15/16 0129  HGB 13.2  --   --   HCT 38.8  --   --   PLT 205  --   --   HEPARINUNFRC  --   --  0.61  CREATININE 0.86  --   --   TROPONINI 1.68* 1.48*  --     Estimated Creatinine Clearance: 70.2 mL/min (by C-G formula based on SCr of 0.86 mg/dL).  Assessment: 60 y/o F with chest pressure on heparin, initial anti-Xa level is therapeutic at 0.61  Goal of Therapy:  Heparin level 0.3-0.7 units/ml Monitor platelets by anticoagulation protocol: Yes   Plan:  -Cont heparin at 800 units/hr -Confirmatory anti-Xa level at 1000  Narda Bonds 08/15/2016,2:33 AM

## 2016-08-15 NOTE — Progress Notes (Signed)
ANTICOAGULATION CONSULT NOTE - Follow Up Consult  Pharmacy Consult for Heparin  Indication: chest pain/ACS  No Known Allergies  Patient Measurements: Height: 5\' 8"  (172.7 cm) Weight: 157 lb 13.6 oz (71.6 kg) IBW/kg (Calculated) : 63.9  Vital Signs: Temp: 98.3 F (36.8 C) (08/19 0611) Temp Source: Oral (08/19 0611) BP: 121/70 (08/19 0611) Pulse Rate: 72 (08/19 0611)  Labs:  Recent Labs  08/14/16 1659 08/14/16 2305 08/15/16 0129 08/15/16 0433 08/15/16 0926  HGB 13.2  --   --  12.7  --   HCT 38.8  --   --  39.2  --   PLT 205  --   --  174  --   HEPARINUNFRC  --   --  0.61  --  0.59  CREATININE 0.86  --   --  0.75  --   TROPONINI 1.68* 1.48*  --  1.28* 1.46*    Estimated Creatinine Clearance: 75.4 mL/min (by C-G formula based on SCr of 0.8 mg/dL).  Assessment: 60 y/o F with chest pressure on heparin and noted at goal (HL= 0.59). Plans noted for cath.   Goal of Therapy:  Heparin level 0.3-0.7 units/ml Monitor platelets by anticoagulation protocol: Yes   Plan:  -Cont heparin at 800 units/hr -Daily heparin level and CBC  Hildred Laser, Pharm D 08/15/2016 11:52 AM

## 2016-08-16 DIAGNOSIS — I255 Ischemic cardiomyopathy: Secondary | ICD-10-CM

## 2016-08-16 LAB — BASIC METABOLIC PANEL
Anion gap: 10 (ref 5–15)
BUN: 13 mg/dL (ref 6–20)
CHLORIDE: 106 mmol/L (ref 101–111)
CO2: 24 mmol/L (ref 22–32)
CREATININE: 0.75 mg/dL (ref 0.44–1.00)
Calcium: 8.8 mg/dL — ABNORMAL LOW (ref 8.9–10.3)
GFR calc Af Amer: >60 mL/min (ref >60)
GLUCOSE: 174 mg/dL — AB (ref 65–99)
Potassium: 3.9 mmol/L (ref 3.5–5.1)
SODIUM: 140 mmol/L (ref 135–145)

## 2016-08-16 LAB — CBC
HCT: 37.4 % (ref 36.0–46.0)
Hemoglobin: 11.9 g/dL — ABNORMAL LOW (ref 12.0–15.0)
MCH: 31 pg (ref 26.0–34.0)
MCHC: 31.8 g/dL (ref 30.0–36.0)
MCV: 97.4 fL (ref 78.0–100.0)
Platelets: 172 10*3/uL (ref 150–400)
RBC: 3.84 MIL/uL — ABNORMAL LOW (ref 3.87–5.11)
RDW: 13 % (ref 11.5–15.5)
WBC: 6.3 10*3/uL (ref 4.0–10.5)

## 2016-08-16 LAB — GLUCOSE, CAPILLARY
GLUCOSE-CAPILLARY: 297 mg/dL — AB (ref 65–99)
Glucose-Capillary: 153 mg/dL — ABNORMAL HIGH (ref 65–99)
Glucose-Capillary: 200 mg/dL — ABNORMAL HIGH (ref 65–99)
Glucose-Capillary: 224 mg/dL — ABNORMAL HIGH (ref 65–99)
Glucose-Capillary: 245 mg/dL — ABNORMAL HIGH (ref 65–99)

## 2016-08-16 LAB — HEPARIN LEVEL (UNFRACTIONATED): Heparin Unfractionated: 0.47 [IU]/mL (ref 0.30–0.70)

## 2016-08-16 MED ORDER — SODIUM CHLORIDE 0.9 % WEIGHT BASED INFUSION: 1.0000 mL/kg/h | INTRAVENOUS | Status: DC

## 2016-08-16 MED ORDER — SODIUM CHLORIDE 0.9 % WEIGHT BASED INFUSION
3.0000 mL/kg/h | INTRAVENOUS | Status: DC
  Administered 2016-08-17: 3 mL/kg/h via INTRAVENOUS

## 2016-08-16 MED ORDER — ASPIRIN 81 MG PO CHEW
81.0000 mg | CHEWABLE_TABLET | ORAL | Status: AC
  Administered 2016-08-17: 81 mg via ORAL
  Filled 2016-08-16: qty 1

## 2016-08-16 NOTE — Progress Notes (Signed)
Patient lying in bed, tylenol given for pain. Husband at bedside, no other needs at this time. Call light within reach.

## 2016-08-16 NOTE — Progress Notes (Signed)
Primary cardiologist: Dr. Landry Corporal  Seen for followup: NSTEMI  Subjective:    Recurrent chest pain overnight, improved with nitroglycerin. Pain free at this time.  Objective:   Temp:  [98 F (36.7 C)-98.3 F (36.8 C)] 98 F (36.7 C) (08/20 0644) Pulse Rate:  [80-90] 90 (08/20 0644) Resp:  [14-18] 14 (08/19 2030) BP: (106-131)/(54-69) 121/60 (08/20 0644) SpO2:  [97 %-100 %] 97 % (08/20 0644) Last BM Date: 08/14/16  Filed Weights   08/14/16 1651 08/15/16 0012  Weight: 160 lb (72.6 kg) 157 lb 13.6 oz (71.6 kg)    Intake/Output Summary (Last 24 hours) at 08/16/16 1248 Last data filed at 08/16/16 0718  Gross per 24 hour  Intake             1544 ml  Output             4350 ml  Net            -2806 ml    Telemetry: Sinus rhythm.  Exam:  General: Appears comfortable at rest.  Lungs: Clear, nonlabored.  Cardiac: RRR without gallop.  Abdomen: NABS.  Extremities: No pitting edema.  Lab Results:  Basic Metabolic Panel:  Recent Labs Lab 08/14/16 1659 08/15/16 0433 08/16/16 0236  NA 140 141 140  K 3.6 3.9 3.9  CL 104 110 106  CO2 28 25 24   GLUCOSE 88 136* 174*  BUN 16 12 13   CREATININE 0.86 0.75 0.75  CALCIUM 9.1 8.7* 8.8*    Liver Function Tests:  Recent Labs Lab 08/14/16 1659  AST 31  ALT 23  ALKPHOS 68  BILITOT 0.9  PROT 7.2  ALBUMIN 4.1    CBC:  Recent Labs Lab 08/14/16 1659 08/15/16 0433 08/16/16 0236  WBC 5.6 5.3 6.3  HGB 13.2 12.7 11.9*  HCT 38.8 39.2 37.4  MCV 94.9 96.1 97.4  PLT 205 174 172    Cardiac Enzymes:  Recent Labs Lab 08/14/16 2305 08/15/16 0433 08/15/16 0926  TROPONINI 1.48* 1.28* 1.46*    ECG: Tracing from 08/15/2016 shows sinus rhythm with poor progression, rule out anterior infarct pattern with anterolateral T-wave inversions and prolonged QTc.  Echocardiogram 08/15/2016: Study Conclusions  - Left ventricle: The cavity size was normal. Wall thickness was   normal. Systolic function was  mildly to moderately reduced. The   estimated ejection fraction was in the range of 40% to 45%. There   is akinesis of the mid-apicalanteroseptal, anterolateral, and   apical myocardium. - Pulmonary arteries: Systolic pressure was mildly to moderately   increased. PA peak pressure: 42 mm Hg (S).  Medications:   Scheduled Medications: . aspirin EC  81 mg Oral Daily  . atorvastatin  80 mg Oral q1800  . [START ON 08/17/2016] ferrous sulfate  325 mg Oral Q M,W,F  . insulin aspart  0-15 Units Subcutaneous TID WC  . insulin glargine  18 Units Subcutaneous QHS  . lisinopril  10 mg Oral Daily  . [START ON 08/17/2016] multivitamin with minerals  1 tablet Oral Q M,W,F  . omega-3 acid ethyl esters  1 g Oral Daily  . pantoprazole  80 mg Oral Daily  . polycarbophil  625 mg Oral Q breakfast  . sodium chloride flush  3 mL Intravenous Q12H    Infusions: . sodium chloride 125 mL/hr at 08/16/16 1023  . heparin 800 Units/hr (08/15/16 1931)    PRN Medications: sodium chloride, acetaminophen, nitroGLYCERIN, ondansetron (ZOFRAN) IV, sodium chloride flush   Assessment:   1.  NSTEMI, peak troponin I 1.4. Transient chest discomfort overnight on heparin. ECG shows poor R-wave progression rule out anterior infarct pattern with anterolateral T-wave inversions. Echocardiogram reveals LVEF 40-45% with anterior/anterolateral wall motion abnormality.  2. LDL 128, start high-dose statin in light of ACS.  3. Essential hypertension.  4. Type 2 diabetes mellitus.   Plan/Discussion:    Patient scheduled for cardiac catheterization as per Dr. Wynonia Lawman. Continue aspirin, lisinopril, diabetes regimen, and Lipitor. She continues on heparin as well as. Follow-up lab work a.m.   Satira Sark, M.D., F.A.C.C.

## 2016-08-16 NOTE — Progress Notes (Signed)
Called to room pt c/o chest pain. 2/10. Pressure mid sternal 143/77- 95- 18..... Sat 98% ra. Pt on tele 95 NSR with Occasional PVC's. Nitro given x1 . Will obtain an EKG and report finding to MD. Post 5 mins after nirtro given  Pt reports CP is completely gone.Post nitro vitals......118 /63 ....  HR 103... Resp - -16.... RA sat 95%

## 2016-08-16 NOTE — Progress Notes (Signed)
ANTICOAGULATION CONSULT NOTE - Follow Up Consult  Pharmacy Consult for Heparin  Indication: chest pain/ACS  No Known Allergies  Patient Measurements: Height: 5\' 8"  (172.7 cm) Weight: 157 lb 13.6 oz (71.6 kg) IBW/kg (Calculated) : 63.9  Vital Signs: Temp: 98 F (36.7 C) (08/20 0644) Temp Source: Oral (08/20 0644) BP: 121/60 (08/20 0644) Pulse Rate: 90 (08/20 0644)  Labs:  Recent Labs  08/14/16 1659 08/14/16 2305 08/15/16 0129 08/15/16 0433 08/15/16 0926 08/16/16 0236  HGB 13.2  --   --  12.7  --  11.9*  HCT 38.8  --   --  39.2  --  37.4  PLT 205  --   --  174  --  172  HEPARINUNFRC  --   --  0.61  --  0.59 0.47  CREATININE 0.86  --   --  0.75  --  0.75  TROPONINI 1.68* 1.48*  --  1.28* 1.46*  --     Estimated Creatinine Clearance: 75.4 mL/min (by C-G formula based on SCr of 0.8 mg/dL).  Assessment: 60 y/o F with chest pressure on heparin and noted at goal (HL= 0.47). Plans noted for cath.   Goal of Therapy:  Heparin level 0.3-0.7 units/ml Monitor platelets by anticoagulation protocol: Yes   Plan:  -Cont heparin at 800 units/hr -Daily heparin level and CBC  Hildred Laser, Pharm D 08/16/2016 11:30 AM

## 2016-08-17 ENCOUNTER — Encounter (HOSPITAL_COMMUNITY): Payer: Self-pay | Admitting: Internal Medicine

## 2016-08-17 ENCOUNTER — Encounter (HOSPITAL_COMMUNITY)
Admission: EM | Disposition: A | Discharge status: Home / Self Care | Payer: Self-pay | Source: Home / Self Care | Attending: Cardiology

## 2016-08-17 DIAGNOSIS — E785 Hyperlipidemia, unspecified: Secondary | ICD-10-CM

## 2016-08-17 DIAGNOSIS — I2510 Atherosclerotic heart disease of native coronary artery without angina pectoris: Secondary | ICD-10-CM

## 2016-08-17 HISTORY — PX: CARDIAC CATHETERIZATION: SHX172

## 2016-08-17 LAB — CBC
HEMATOCRIT: 35.5 % — AB (ref 36.0–46.0)
Hemoglobin: 11.4 g/dL — ABNORMAL LOW (ref 12.0–15.0)
MCH: 30.9 pg (ref 26.0–34.0)
MCHC: 32.1 g/dL (ref 30.0–36.0)
MCV: 96.2 fL (ref 78.0–100.0)
Platelets: 157 10*3/uL (ref 150–400)
RBC: 3.69 MIL/uL — ABNORMAL LOW (ref 3.87–5.11)
RDW: 12.8 % (ref 11.5–15.5)
WBC: 5.6 10*3/uL (ref 4.0–10.5)

## 2016-08-17 LAB — POCT ACTIVATED CLOTTING TIME
ACTIVATED CLOTTING TIME: 230 s
ACTIVATED CLOTTING TIME: 252 s
ACTIVATED CLOTTING TIME: 246 s

## 2016-08-17 LAB — GLUCOSE, CAPILLARY
GLUCOSE-CAPILLARY: 138 mg/dL — AB (ref 65–99)
GLUCOSE-CAPILLARY: 122 mg/dL — AB (ref 65–99)
Glucose-Capillary: 148 mg/dL — ABNORMAL HIGH (ref 65–99)
Glucose-Capillary: 180 mg/dL — ABNORMAL HIGH (ref 65–99)

## 2016-08-17 LAB — TROPONIN I: Troponin I: 2.25 ng/mL (ref <0.03)

## 2016-08-17 LAB — PROTIME-INR
INR: 1.04
Prothrombin Time: 13.6 seconds (ref 11.4–15.2)

## 2016-08-17 LAB — HEPARIN LEVEL (UNFRACTIONATED): Heparin Unfractionated: 0.42 IU/mL (ref 0.30–0.70)

## 2016-08-17 SURGERY — LEFT HEART CATH AND CORONARY ANGIOGRAPHY
Anesthesia: LOCAL

## 2016-08-17 MED ORDER — HEPARIN SODIUM (PORCINE) 1000 UNIT/ML IJ SOLN
INTRAMUSCULAR | Status: DC | PRN
  Administered 2016-08-17: 2500 [IU] via INTRAVENOUS
  Administered 2016-08-17: 3500 [IU] via INTRAVENOUS
  Administered 2016-08-17 (×2): 2000 [IU] via INTRAVENOUS

## 2016-08-17 MED ORDER — LIVING BETTER WITH HEART FAILURE BOOK
Freq: Once | Status: AC
  Administered 2016-08-17: 15:00:00

## 2016-08-17 MED ORDER — FENTANYL CITRATE (PF) 100 MCG/2ML IJ SOLN
INTRAMUSCULAR | Status: AC
  Filled 2016-08-17: qty 2

## 2016-08-17 MED ORDER — HEPARIN SODIUM (PORCINE) 1000 UNIT/ML IJ SOLN
INTRAMUSCULAR | Status: AC
  Filled 2016-08-17: qty 1

## 2016-08-17 MED ORDER — IOPAMIDOL (ISOVUE-370) INJECTION 76%
INTRAVENOUS | Status: AC
  Filled 2016-08-17: qty 100

## 2016-08-17 MED ORDER — IOPAMIDOL (ISOVUE-370) INJECTION 76%
INTRAVENOUS | Status: AC
  Filled 2016-08-17: qty 50

## 2016-08-17 MED ORDER — SODIUM CHLORIDE 0.9 % WEIGHT BASED INFUSION
1.0000 mL/kg/h | INTRAVENOUS | Status: AC
  Administered 2016-08-17: 1 mL/kg/h via INTRAVENOUS

## 2016-08-17 MED ORDER — TICAGRELOR 90 MG PO TABS
90.0000 mg | ORAL_TABLET | Freq: Two times a day (BID) | ORAL | Status: DC
  Administered 2016-08-18 (×2): 90 mg via ORAL
  Filled 2016-08-17 (×2): qty 1

## 2016-08-17 MED ORDER — MIDAZOLAM HCL 2 MG/2ML IJ SOLN
INTRAMUSCULAR | Status: AC
  Filled 2016-08-17: qty 2

## 2016-08-17 MED ORDER — TICAGRELOR 90 MG PO TABS
ORAL_TABLET | ORAL | Status: AC
  Filled 2016-08-17: qty 1

## 2016-08-17 MED ORDER — SODIUM CHLORIDE 0.9 % IV SOLN: 250.0000 mL | INTRAVENOUS | Status: DC | PRN

## 2016-08-17 MED ORDER — ONDANSETRON HCL 4 MG/2ML IJ SOLN
4.0000 mg | Freq: Four times a day (QID) | INTRAMUSCULAR | Status: DC | PRN
  Administered 2016-08-17: 4 mg via INTRAVENOUS
  Filled 2016-08-17: qty 2

## 2016-08-17 MED ORDER — NITROGLYCERIN 1 MG/10 ML FOR IR/CATH LAB
INTRA_ARTERIAL | Status: DC | PRN
  Administered 2016-08-17: 200 ug via INTRACORONARY

## 2016-08-17 MED ORDER — ACETAMINOPHEN 325 MG PO TABS: 650.0000 mg | ORAL_TABLET | ORAL | Status: DC | PRN

## 2016-08-17 MED ORDER — VERAPAMIL HCL 2.5 MG/ML IV SOLN
INTRAVENOUS | Status: AC
  Filled 2016-08-17: qty 2

## 2016-08-17 MED ORDER — SODIUM CHLORIDE 0.9% FLUSH
3.0000 mL | Freq: Two times a day (BID) | INTRAVENOUS | Status: DC
  Administered 2016-08-17: 3 mL via INTRAVENOUS

## 2016-08-17 MED ORDER — HEPARIN SODIUM (PORCINE) 5000 UNIT/ML IJ SOLN
5000.0000 [IU] | Freq: Three times a day (TID) | INTRAMUSCULAR | Status: DC
  Administered 2016-08-17 – 2016-08-18 (×3): 5000 [IU] via SUBCUTANEOUS
  Filled 2016-08-17 (×3): qty 1

## 2016-08-17 MED ORDER — HEPARIN (PORCINE) IN NACL 2-0.9 UNIT/ML-% IJ SOLN
INTRAMUSCULAR | Status: DC | PRN
  Administered 2016-08-17: 1000 mL
  Administered 2016-08-17: 500 mL

## 2016-08-17 MED ORDER — IOPAMIDOL (ISOVUE-370) INJECTION 76%
INTRAVENOUS | Status: DC | PRN
  Administered 2016-08-17: 145 mL via INTRAVENOUS

## 2016-08-17 MED ORDER — METOPROLOL TARTRATE 12.5 MG HALF TABLET
12.5000 mg | ORAL_TABLET | Freq: Two times a day (BID) | ORAL | Status: DC
  Administered 2016-08-17 – 2016-08-18 (×2): 12.5 mg via ORAL
  Filled 2016-08-17 (×2): qty 1

## 2016-08-17 MED ORDER — ANGIOPLASTY BOOK
Freq: Once | Status: AC
  Administered 2016-08-17: 15:00:00
  Filled 2016-08-17: qty 1

## 2016-08-17 MED ORDER — HEPARIN (PORCINE) IN NACL 2-0.9 UNIT/ML-% IJ SOLN
INTRAMUSCULAR | Status: DC | PRN
  Administered 2016-08-17: 10 mL via INTRA_ARTERIAL

## 2016-08-17 MED ORDER — SODIUM CHLORIDE 0.9% FLUSH: 3.0000 mL | INTRAVENOUS | Status: DC | PRN

## 2016-08-17 MED ORDER — TICAGRELOR 90 MG PO TABS
ORAL_TABLET | ORAL | Status: DC | PRN
  Administered 2016-08-17: 180 mg via ORAL

## 2016-08-17 MED ORDER — HEART ATTACK BOUNCING BOOK
Freq: Once | Status: AC
  Administered 2016-08-17: 15:00:00
  Filled 2016-08-17: qty 1

## 2016-08-17 MED ORDER — MIDAZOLAM HCL 2 MG/2ML IJ SOLN
INTRAMUSCULAR | Status: DC | PRN
  Administered 2016-08-17: 1 mg via INTRAVENOUS

## 2016-08-17 MED ORDER — FENTANYL CITRATE (PF) 100 MCG/2ML IJ SOLN
INTRAMUSCULAR | Status: DC | PRN
  Administered 2016-08-17: 50 ug via INTRAVENOUS

## 2016-08-17 MED ORDER — ASPIRIN 81 MG PO CHEW: 81.0000 mg | CHEWABLE_TABLET | Freq: Every day | ORAL | Status: DC

## 2016-08-17 MED ORDER — HEPARIN (PORCINE) IN NACL 2-0.9 UNIT/ML-% IJ SOLN
INTRAMUSCULAR | Status: AC
  Filled 2016-08-17: qty 1500

## 2016-08-17 MED ORDER — NITROGLYCERIN 1 MG/10 ML FOR IR/CATH LAB
INTRA_ARTERIAL | Status: AC
  Filled 2016-08-17: qty 10

## 2016-08-17 MED ORDER — LIDOCAINE HCL (PF) 1 % IJ SOLN
INTRAMUSCULAR | Status: DC | PRN
  Administered 2016-08-17: 2 mL

## 2016-08-17 MED ORDER — LIDOCAINE HCL (PF) 1 % IJ SOLN
INTRAMUSCULAR | Status: AC
  Filled 2016-08-17: qty 30

## 2016-08-17 SURGICAL SUPPLY — 20 items
BALLN EMERGE MR 2.25X12 (BALLOONS) ×2
BALLN ~~LOC~~ EMERGE MR 3.75X8 (BALLOONS) ×2
BALLOON EMERGE MR 2.25X12 (BALLOONS) ×1 IMPLANT
BALLOON ~~LOC~~ EMERGE MR 3.75X8 (BALLOONS) ×1 IMPLANT
CATH IMPULSE 5F ANG/FL3.5 (CATHETERS) ×2 IMPLANT
CATH OPTICROSS 40MHZ (CATHETERS) ×2 IMPLANT
CATH VISTA GUIDE 6FR XBLAD3.0 (CATHETERS) ×2 IMPLANT
DEVICE RAD COMP TR BAND LRG (VASCULAR PRODUCTS) ×2 IMPLANT
GLIDESHEATH SLEND SS 6F .021 (SHEATH) ×2 IMPLANT
KIT ENCORE 26 ADVANTAGE (KITS) ×2 IMPLANT
KIT HEART LEFT (KITS) ×2 IMPLANT
PACK CARDIAC CATHETERIZATION (CUSTOM PROCEDURE TRAY) ×2 IMPLANT
SLED PULL BACK IVUS (MISCELLANEOUS) ×2 IMPLANT
STENT RESOLUTE INTEG 3.5X15 (Permanent Stent) ×2 IMPLANT
SYR MEDRAD MARK V 150ML (SYRINGE) ×2 IMPLANT
TRANSDUCER W/STOPCOCK (MISCELLANEOUS) ×2 IMPLANT
TUBING CIL FLEX 10 FLL-RA (TUBING) ×2 IMPLANT
WIRE ASAHI PROWATER 180CM (WIRE) ×2 IMPLANT
WIRE HI TORQ BMW 190CM (WIRE) ×2 IMPLANT
WIRE SAFE-T 1.5MM-J .035X260CM (WIRE) ×2 IMPLANT

## 2016-08-17 NOTE — H&P (View-Only) (Signed)
Primary cardiologist: Dr. Landry Corporal  Seen for followup: NSTEMI  Subjective:    Recurrent chest pain overnight, improved with nitroglycerin. Pain free at this time.  Objective:   Temp:  [97.8 F (36.6 C)-98.2 F (36.8 C)] 97.8 F (36.6 C) (08/21 0411) Pulse Rate:  [79-99] 79 (08/21 0411) Resp:  [18-19] 18 (08/21 0411) BP: (109-130)/(67-80) 109/74 (08/21 0411) SpO2:  [99 %-100 %] 100 % (08/21 0411) Weight:  [156 lb 9.6 oz (71 kg)] 156 lb 9.6 oz (71 kg) (08/20 2140) Last BM Date: 08/16/16  Filed Weights   08/14/16 1651 08/15/16 0012 08/16/16 2140  Weight: 160 lb (72.6 kg) 157 lb 13.6 oz (71.6 kg) 156 lb 9.6 oz (71 kg)    Intake/Output Summary (Last 24 hours) at 08/17/16 0843 Last data filed at 08/17/16 0413  Gross per 24 hour  Intake             1480 ml  Output             4400 ml  Net            -2920 ml    Telemetry: Sinus rhythm.  Exam:  General: Appears comfortable at rest.  Lungs: Clear, nonlabored.  Cardiac: RRR without gallop.  Abdomen: NABS.  Extremities: No pitting edema.  Lab Results:  Basic Metabolic Panel:  Recent Labs Lab 08/14/16 1659 08/15/16 0433 08/16/16 0236  NA 140 141 140  K 3.6 3.9 3.9  CL 104 110 106  CO2 28 25 24   GLUCOSE 88 136* 174*  BUN 16 12 13   CREATININE 0.86 0.75 0.75  CALCIUM 9.1 8.7* 8.8*    Liver Function Tests:  Recent Labs Lab 08/14/16 1659  AST 31  ALT 23  ALKPHOS 68  BILITOT 0.9  PROT 7.2  ALBUMIN 4.1    CBC:  Recent Labs Lab 08/15/16 0433 08/16/16 0236 08/17/16 0321  WBC 5.3 6.3 5.6  HGB 12.7 11.9* 11.4*  HCT 39.2 37.4 35.5*  MCV 96.1 97.4 96.2  PLT 174 172 157    Cardiac Enzymes:  Recent Labs Lab 08/15/16 0433 08/15/16 0926 08/17/16 0321  TROPONINI 1.28* 1.46* 2.25*    ECG: Tracing from 08/15/2016 shows sinus rhythm with poor progression, rule out anterior infarct pattern with anterolateral T-wave inversions and prolonged QTc.  Echocardiogram 08/15/2016: Study  Conclusions  - Left ventricle: The cavity size was normal. Wall thickness was   normal. Systolic function was mildly to moderately reduced. The   estimated ejection fraction was in the range of 40% to 45%. There   is akinesis of the mid-apicalanteroseptal, anterolateral, and   apical myocardium. - Pulmonary arteries: Systolic pressure was mildly to moderately   increased. PA peak pressure: 42 mm Hg (S).  Medications:   Scheduled Medications: . aspirin EC  81 mg Oral Daily  . atorvastatin  80 mg Oral q1800  . ferrous sulfate  325 mg Oral Q M,W,F  . insulin aspart  0-15 Units Subcutaneous TID WC  . insulin glargine  18 Units Subcutaneous QHS  . lisinopril  10 mg Oral Daily  . multivitamin with minerals  1 tablet Oral Q M,W,F  . omega-3 acid ethyl esters  1 g Oral Daily  . pantoprazole  80 mg Oral Daily  . polycarbophil  625 mg Oral Q breakfast  . sodium chloride flush  3 mL Intravenous Q12H    Infusions: . sodium chloride 125 mL/hr at 08/16/16 1935  . sodium chloride 1 mL/kg/hr (08/17/16 0722)  .  heparin 800 Units/hr (08/17/16 0348)    PRN Medications: sodium chloride, acetaminophen, nitroGLYCERIN, ondansetron (ZOFRAN) IV, sodium chloride flush   Assessment:   1. NSTEMI, peak troponin I 1.4. Transient chest discomfort overnight on heparin. ECG shows poor R-wave progression rule out anterior infarct pattern with anterolateral T-wave inversions. Echocardiogram reveals LVEF 40-45% with anterior/anterolateral wall motion abnormality.  Has a nickel allergy.   Will need to use a stent that does not have nickel.   2. LDL 128, start high-dose statin in light of ACS.  3. Essential hypertension.  4. Type 2 diabetes mellitus.

## 2016-08-17 NOTE — Progress Notes (Signed)
Primary cardiologist: Dr. Landry Corporal  Seen for followup: NSTEMI  Subjective:    Recurrent chest pain overnight, improved with nitroglycerin. Pain free at this time.  Objective:   Temp:  [97.8 F (36.6 C)-98.2 F (36.8 C)] 97.8 F (36.6 C) (08/21 0411) Pulse Rate:  [79-99] 79 (08/21 0411) Resp:  [18-19] 18 (08/21 0411) BP: (109-130)/(67-80) 109/74 (08/21 0411) SpO2:  [99 %-100 %] 100 % (08/21 0411) Weight:  [156 lb 9.6 oz (71 kg)] 156 lb 9.6 oz (71 kg) (08/20 2140) Last BM Date: 08/16/16  Filed Weights   08/14/16 1651 08/15/16 0012 08/16/16 2140  Weight: 160 lb (72.6 kg) 157 lb 13.6 oz (71.6 kg) 156 lb 9.6 oz (71 kg)    Intake/Output Summary (Last 24 hours) at 08/17/16 0843 Last data filed at 08/17/16 0413  Gross per 24 hour  Intake             1480 ml  Output             4400 ml  Net            -2920 ml    Telemetry: Sinus rhythm.  Exam:  General: Appears comfortable at rest.  Lungs: Clear, nonlabored.  Cardiac: RRR without gallop.  Abdomen: NABS.  Extremities: No pitting edema.  Lab Results:  Basic Metabolic Panel:  Recent Labs Lab 08/14/16 1659 08/15/16 0433 08/16/16 0236  NA 140 141 140  K 3.6 3.9 3.9  CL 104 110 106  CO2 28 25 24   GLUCOSE 88 136* 174*  BUN 16 12 13   CREATININE 0.86 0.75 0.75  CALCIUM 9.1 8.7* 8.8*    Liver Function Tests:  Recent Labs Lab 08/14/16 1659  AST 31  ALT 23  ALKPHOS 68  BILITOT 0.9  PROT 7.2  ALBUMIN 4.1    CBC:  Recent Labs Lab 08/15/16 0433 08/16/16 0236 08/17/16 0321  WBC 5.3 6.3 5.6  HGB 12.7 11.9* 11.4*  HCT 39.2 37.4 35.5*  MCV 96.1 97.4 96.2  PLT 174 172 157    Cardiac Enzymes:  Recent Labs Lab 08/15/16 0433 08/15/16 0926 08/17/16 0321  TROPONINI 1.28* 1.46* 2.25*    ECG: Tracing from 08/15/2016 shows sinus rhythm with poor progression, rule out anterior infarct pattern with anterolateral T-wave inversions and prolonged QTc.  Echocardiogram 08/15/2016: Study  Conclusions  - Left ventricle: The cavity size was normal. Wall thickness was   normal. Systolic function was mildly to moderately reduced. The   estimated ejection fraction was in the range of 40% to 45%. There   is akinesis of the mid-apicalanteroseptal, anterolateral, and   apical myocardium. - Pulmonary arteries: Systolic pressure was mildly to moderately   increased. PA peak pressure: 42 mm Hg (S).  Medications:   Scheduled Medications: . aspirin EC  81 mg Oral Daily  . atorvastatin  80 mg Oral q1800  . ferrous sulfate  325 mg Oral Q M,W,F  . insulin aspart  0-15 Units Subcutaneous TID WC  . insulin glargine  18 Units Subcutaneous QHS  . lisinopril  10 mg Oral Daily  . multivitamin with minerals  1 tablet Oral Q M,W,F  . omega-3 acid ethyl esters  1 g Oral Daily  . pantoprazole  80 mg Oral Daily  . polycarbophil  625 mg Oral Q breakfast  . sodium chloride flush  3 mL Intravenous Q12H    Infusions: . sodium chloride 125 mL/hr at 08/16/16 1935  . sodium chloride 1 mL/kg/hr (08/17/16 0722)  .  heparin 800 Units/hr (08/17/16 0348)    PRN Medications: sodium chloride, acetaminophen, nitroGLYCERIN, ondansetron (ZOFRAN) IV, sodium chloride flush   Assessment:   1. NSTEMI, peak troponin I 1.4. Transient chest discomfort overnight on heparin. ECG shows poor R-wave progression rule out anterior infarct pattern with anterolateral T-wave inversions. Echocardiogram reveals LVEF 40-45% with anterior/anterolateral wall motion abnormality.  Has a nickel allergy.   Will need to use a stent that does not have nickel.   2. LDL 128, start high-dose statin in light of ACS.  3. Essential hypertension.  4. Type 2 diabetes mellitus.

## 2016-08-17 NOTE — Interval H&P Note (Signed)
History and Physical Interval Note:  08/17/2016 12:19 PM  Rhonda Allen  has presented today for surgery, with the diagnosis of NSTEMI  The various methods of treatment have been discussed with the patient and family. After consideration of risks, benefits and other options for treatment, the patient has consented to  Procedure(s): Left Heart Cath and Coronary Angiography (N/A) as a surgical intervention .  The patient's history has been reviewed, patient examined, no change in status, stable for surgery.  I have reviewed the patient's chart and labs.  Questions were answered to the patient's satisfaction.     Cath Lab Visit (complete for each Cath Lab visit)  Clinical Evaluation Leading to the Procedure:   ACS: Yes.    Non-ACS:    Anginal Classification: CCS IV  Anti-ischemic medical therapy: No Therapy  Non-Invasive Test Results: No non-invasive testing performed; resting echo demonstrates anterior/anterolateral wall motion abnormality with moderately reduced LV contraction.  Prior CABG: No previous CABG        [name]

## 2016-08-17 NOTE — Progress Notes (Signed)
TR BAND REMOVAL  LOCATION:    right radial  DEFLATED PER PROTOCOL:    Yes.    TIME BAND OFF / DRESSING APPLIED:    1830   SITE UPON ARRIVAL:    Level 0  SITE AFTER BAND REMOVAL:    Level 0  CIRCULATION SENSATION AND MOVEMENT:    Within Normal Limits   Yes.    COMMENTS:   Patient tolerated well, a little oozing noted, resolved, pressure dressing applied. No hematoma noted, right radial site soft to touch. No s/s of distress noted or complaints voiced at this time.

## 2016-08-17 NOTE — Care Management Note (Addendum)
Case Management Note  Patient Details  Name: Rhonda Allen MRN: [id number] Date of Birth: [birth year]  Subjective/Objective:     Patient is s/p coronary stent intervention, will be on Brilinta per MD note cath procedure NCM awaiting benefit check.  NCM will cont to follow for dc needs.  NCM gave patient 30 day free savings card and she will go to [facility] outpatient pharmacy to pick up with paper script.    Per benefit check , thru [employer] pay is 75.00.               Action/Plan:   Expected Discharge Date:                  Expected Discharge Plan:  Home/Self Care  In-House Referral:     Discharge planning Services  CM Consult  Post Acute Care Choice:    Choice offered to:     DME Arranged:    DME Agency:     HH Arranged:    HH Agency:     Status of Service:  In process, will continue to follow  If discussed at Long Length of Stay Meetings, dates discussed:    Additional Comments:  Zenon Mayo, RN 08/17/2016, 2:58 PM

## 2016-08-17 NOTE — Interval H&P Note (Signed)
Cath Lab Visit (complete for each Cath Lab visit)  Clinical Evaluation Leading to the Procedure:   ACS: Yes.    Non-ACS:    Anginal Classification: CCS III  Anti-ischemic medical therapy: Minimal Therapy (1 class of medications)  Non-Invasive Test Results: No non-invasive testing performed  Prior CABG: No previous CABG      History and Physical Interval Note:  08/17/2016 12:07 PM  Rhonda Allen  has presented today for surgery, with the diagnosis of unstable angina  The various methods of treatment have been discussed with the patient and family. After consideration of risks, benefits and other options for treatment, the patient has consented to  Procedure(s): Left Heart Cath and Coronary Angiography (N/A) as a surgical intervention .  The patient's history has been reviewed, patient examined, no change in status, stable for surgery.  I have reviewed the patient's chart and labs.  Questions were answered to the patient's satisfaction.     Belva Crome III

## 2016-08-17 NOTE — Progress Notes (Signed)
ANTICOAGULATION CONSULT NOTE - Follow Up Consult  Pharmacy Consult for Heparin  Indication: chest pain/ACS  No Known Allergies  Patient Measurements: Height: 5\' 8"  (172.7 cm) Weight: 156 lb 9.6 oz (71 kg) IBW/kg (Calculated) : 63.9  Vital Signs: Temp: 97.8 F (36.6 C) (08/21 0411) Temp Source: Oral (08/21 0411) BP: 109/74 (08/21 0411) Pulse Rate: 79 (08/21 0411)  Labs:  Recent Labs  08/14/16 1659  08/15/16 0433 08/15/16 0926 08/16/16 0236 08/17/16 0321  HGB 13.2  --  12.7  --  11.9* 11.4*  HCT 38.8  --  39.2  --  37.4 35.5*  PLT 205  --  174  --  172 157  HEPARINUNFRC  --   < >  --  0.59 0.47 0.42  CREATININE 0.86  --  0.75  --  0.75  --   TROPONINI 1.68*  < > 1.28* 1.46*  --  2.25*  < > = values in this interval not displayed.  Estimated Creatinine Clearance: 75.4 mL/min (by C-G formula based on SCr of 0.8 mg/dL).  Assessment: 60 y/o F with chest pressure on heparin and noted at goal (HL= 0.47). Plans noted for cath.   This morning's HL remains therapeutic at 0.42 on heparin 800 units/hr. No issues with infusion or bleeding noted.  Goal of Therapy:  Heparin level 0.3-0.7 units/ml Monitor platelets by anticoagulation protocol: Yes   Plan:  Continue heparin 800 units/hr Daily heparin level and CBC Monitor s/sx of bleeding   Andrey Cota. Diona Foley, PharmD, [facility] Clinical Pharmacist Pager [phone] 08/17/2016 8:58 AM

## 2016-08-18 LAB — BASIC METABOLIC PANEL
ANION GAP: 9 (ref 5–15)
BUN: 11 mg/dL (ref 6–20)
CHLORIDE: 104 mmol/L (ref 101–111)
CO2: 23 mmol/L (ref 22–32)
Calcium: 9 mg/dL (ref 8.9–10.3)
Creatinine, Ser: 0.86 mg/dL (ref 0.44–1.00)
GFR calc non Af Amer: >60 mL/min (ref >60)
Glucose, Bld: 171 mg/dL — ABNORMAL HIGH (ref 65–99)
POTASSIUM: 3.8 mmol/L (ref 3.5–5.1)
SODIUM: 136 mmol/L (ref 135–145)

## 2016-08-18 LAB — CBC
HEMATOCRIT: 37 % (ref 36.0–46.0)
HEMOGLOBIN: 12 g/dL (ref 12.0–15.0)
MCH: 31 pg (ref 26.0–34.0)
MCHC: 32.4 g/dL (ref 30.0–36.0)
MCV: 95.6 fL (ref 78.0–100.0)
Platelets: 158 10*3/uL (ref 150–400)
RBC: 3.87 MIL/uL (ref 3.87–5.11)
RDW: 13 % (ref 11.5–15.5)
WBC: 6.3 10*3/uL (ref 4.0–10.5)

## 2016-08-18 LAB — GLUCOSE, CAPILLARY
GLUCOSE-CAPILLARY: 131 mg/dL — AB (ref 65–99)
GLUCOSE-CAPILLARY: 285 mg/dL — AB (ref 65–99)

## 2016-08-18 MED ORDER — ATORVASTATIN CALCIUM 80 MG PO TABS: 80.0000 mg | ORAL_TABLET | Freq: Every day | ORAL | 5 refills | Status: AC

## 2016-08-18 MED ORDER — TICAGRELOR 90 MG PO TABS: 90.0000 mg | ORAL_TABLET | Freq: Two times a day (BID) | ORAL | 10 refills | Status: DC

## 2016-08-18 MED ORDER — METOPROLOL TARTRATE 25 MG PO TABS: 12.5000 mg | ORAL_TABLET | Freq: Two times a day (BID) | ORAL | 5 refills | Status: DC

## 2016-08-18 MED ORDER — NITROGLYCERIN 0.4 MG SL SUBL: 0.4000 mg | SUBLINGUAL_TABLET | SUBLINGUAL | 2 refills | Status: DC | PRN

## 2016-08-18 MED ORDER — TICAGRELOR 90 MG PO TABS
90.0000 mg | ORAL_TABLET | Freq: Two times a day (BID) | ORAL | 0 refills | Status: DC
Start: 2016-08-18 — End: 2016-09-01

## 2016-08-18 MED FILL — BRILINTA 90 MG TABLET: 90 | 30 days supply | Qty: 60 | Fill #0

## 2016-08-18 NOTE — Progress Notes (Signed)
CARDIAC REHAB PHASE I   PRE:  Rate/Rhythm: 88 SR    BP: sitting 97/51    SaO2:   MODE:  Ambulation: 400 ft   POST:  Rate/Rhythm: 106 ST    BP: sitting 121/55     SaO2:   Tolerated well, no c/o. Ed completed with pt and husband. Good reception. Understands importance of Brilinta. Will send referral to [facility] however [facility] might work out better (so will let them know as well).  [place], ACSM 08/18/2016 8:57 AM

## 2016-08-18 NOTE — Progress Notes (Signed)
Patient Profile: 59 y/o female with h/o DM admitted for CP and ruled in for NSTEMI.  Subjective: Doing well. No recurrent CP. No dyspnea. Radial cath site is stable.   Objective: Vital signs in last 24 hours: Temp:  [97.9 F (36.6 C)-98.1 F (36.7 C)] 97.9 F (36.6 C) (08/22 0710) Pulse Rate:  [0-104] 77 (08/22 0710) Resp:  [0-47] 14 (08/22 0710) BP: (95-149)/(48-89) 97/51 (08/22 0710) SpO2:  [0 %-100 %] 99 % (08/22 0710) Weight:  [158 lb 8.2 oz (71.9 kg)] 158 lb 8.2 oz (71.9 kg) (08/22 0710) Last BM Date: 08/16/16  Intake/Output from previous day: 08/21 0701 - 08/22 0700 In: 377.3 [P.O.:240; I.V.:137.3] Out: 2100 [Urine:2100] Intake/Output this shift: Total I/O In: -  Out: 400 [Urine:400]  Medications Current Facility-Administered Medications  Medication Dose Route Frequency Provider Last Rate Last Dose  . 0.9 %  sodium chloride infusion  250 mL Intravenous PRN Belva Crome, MD      . acetaminophen (TYLENOL) tablet 650 mg  650 mg Oral Q4H PRN Belva Crome, MD      . aspirin EC tablet 81 mg  81 mg Oral Daily [place], [place]   81 mg at 08/16/16 1020  . atorvastatin (LIPITOR) tablet 80 mg  80 mg Oral q1800 Satira Sark, MD   80 mg at 08/17/16 1646  . ferrous sulfate tablet 325 mg  325 mg Oral Q M,W,F Almyra Deforest, [place]   325 mg at 08/17/16 1527  . heparin injection 5,000 Units  5,000 Units Subcutaneous Q8H Belva Crome, MD   5,000 Units at 08/18/16 [phone]  . insulin aspart (novoLOG) injection 0-15 Units  0-15 Units Subcutaneous TID WC Almyra Deforest, PA   2 Units at 08/18/16 0706  . insulin glargine (LANTUS) injection 18 Units  18 Units Subcutaneous QHS Almyra Deforest, [place]   18 Units at 08/17/16 2157  . lisinopril (PRINIVIL,ZESTRIL) tablet 10 mg  10 mg Oral Daily Almyra Deforest, [place]   10 mg at 08/17/16 1528  . metoprolol tartrate (LOPRESSOR) tablet 12.5 mg  12.5 mg Oral BID Nelva Bush, MD   12.5 mg at 08/17/16 2156  . multivitamin with minerals tablet 1 tablet  1 tablet Oral Q M,W,F Almyra Deforest, [place]   1 tablet at 08/17/16 1528  . nitroGLYCERIN (NITROSTAT) SL tablet 0.4 mg  0.4 mg Sublingual Q5 min PRN Merrily Pew, MD   0.4 mg at 08/16/16 1542  . omega-3 acid ethyl esters (LOVAZA) capsule 1 g  1 g Oral Daily Almyra Deforest, [place]   1 g at 08/17/16 1528  . ondansetron (ZOFRAN) injection 4 mg  4 mg Intravenous Q6H PRN Belva Crome, MD   4 mg at 08/17/16 2144  . pantoprazole (PROTONIX) EC tablet 80 mg  80 mg Oral Daily Almyra Deforest, [place]   80 mg at 08/17/16 1527  . polycarbophil (FIBERCON) tablet 625 mg  625 mg Oral Q breakfast Almyra Deforest, [place]   625 mg at 08/18/16 0656  . sodium chloride flush (NS) 0.9 % injection 3 mL  3 mL Intravenous Q12H Belva Crome, MD   3 mL at 08/17/16 1853  . sodium chloride flush (NS) 0.9 % injection 3 mL  3 mL Intravenous PRN Belva Crome, MD      . ticagrelor [facility]) tablet 90 mg  90 mg Oral BID Belva Crome, MD   90 mg at 08/18/16 0109    PE: General appearance: alert, cooperative and no distress  Neck: no carotid bruit and no JVD Lungs: clear to auscultation bilaterally Heart: regular rate and rhythm, S1, S2 normal, no murmur, click, rub or gallop Extremities: no LEE Pulses: 2+ and symmetric Skin: warm and dry Neurologic: Grossly normal  Lab Results:   Recent Labs  08/16/16 0236 08/17/16 0321 08/18/16 0329  WBC 6.3 5.6 6.3  HGB 11.9* 11.4* 12.0  HCT 37.4 35.5* 37.0  PLT 172 157 158   BMET  Recent Labs  08/16/16 0236 08/18/16 0329  NA 140 136  K 3.9 3.8  CL 106 104  CO2 24 23  GLUCOSE 174* 171*  BUN 13 11  CREATININE 0.75 0.86  CALCIUM 8.8* 9.0   PT/INR  Recent Labs  08/17/16 1148  LABPROT 13.6  INR 1.04   Lipid Panel     Component Value Date/Time   CHOL 216 (H) 08/15/2016 0433   TRIG 40 08/15/2016 0433   HDL 80 08/15/2016 0433   CHOLHDL 2.7 08/15/2016 0433   VLDL 8 08/15/2016 0433   LDLCALC 128 (H) 08/15/2016 0433   Cardiac Panel (last 3 results)  Recent Labs  08/15/16 0926 08/17/16 0321  TROPONINI 1.46* 2.25*     Studies/Results: Procedures   Coronary Stent Intervention  Intravascular Ultrasound/IVUS  Left Heart Cath and Coronary Angiography  Conclusion   1. Severe single-vessel coronary artery disease with 99% stenosis of the proximal LAD. 2. Successful IVUS-guided PCI to the proximal LAD with placement of an Integrity Resolute 3.5 x 15 mm drug-eluting stent with 0% residual stenosis and TIMI-3 flow. 3. Upper normal left ventricular filling pressure.    2D Echo  Study Conclusions  - Left ventricle: The cavity size was normal. Wall thickness was   normal. Systolic function was mildly to moderately reduced. The   estimated ejection fraction was in the range of 40% to 45%. There   is akinesis of the mid-apicalanteroseptal, anterolateral, and   apical myocardium. - Pulmonary arteries: Systolic pressure was mildly to moderately   increased. PA peak pressure: 42 mm Hg (S).   Assessment/Plan  Principal Problem:   Non-STEMI (non-ST elevated myocardial infarction) ([place]) Active Problems:   Essential hypertension   Insulin dependent type 2 diabetes mellitus, controlled ([place])   NSTEMI (non-ST elevated myocardial infarction) ([place])    1. NSTEMI/ CAD: troponin peaked at 2.25. S/p LHC revealing Severe single-vessel coronary artery disease with 99% stenosis of the proximal LAD. S/p Successful IVUS-guided PCI to the proximal LAD with placement of an Integrity Resolute 3.5 x 15 mm drug-eluting stent with 0% residual stenosis and TIMI-3 flow. EF 40-45% by echo. CP and dyspnea free. Ambulate with cardiac rehab today. Continue DAPT with ASA + Brilinta, BB, ACE-I and high intensity statin.    2. LV Systolic Dysfunction: EF [id number] by echo. No dyspnea. Volume stable. Continue BB + ACE-I. Given LV dysfunction, consider changing BB to long acting metoprolol vs Coreg. Low sodium diet.   3. IDDM Type 2: Novolog TID WC and Lantus QHS. PCP manages in outpatient setting.   4. HLD: LDL 128 mg/dL. Goal LDL  in the setting of CAD is <70 mg/dL. Continue high intensity statin, Lipitor 80 mg nightly. Recheck FLP + HFTs in 6 weeks.   5. Post Cath: renal function stable. Cath site is stable.   Dispo: home later today. Patient lives in [place]. Will arrange initial post hospital f/u with APP in [place] office.     LOS: 4 days    Brittainy M. Ladoris Gene 08/18/2016 7:42 AM  Attending Note:   The patient was seen and examined.  Agree with assessment and plan as noted above.  Changes made to the above note as needed.  Patient seen and independently examined with Lyda Jester, PA .   We discussed all aspects of the encounter. I agree with the assessment and plan as stated above.  Pt has done well following PCI.  Has requested that we sign short term leave of absence papers for her work  Admitted on 08/14/16 Will return to work on 08/24/16  Advised her to not lift anything heavy with right hand Ok to walk Encouraged her to participate in cardiac rehab She will follow up with our [place] office.   DC on brilinta and ASA Lipitor for hyperlipidemia  Metoprolol 12. 5 bID has been added.     I have spent a total of 40 minutes with patient reviewing hospital  notes , telemetry, EKGs, labs and examining patient as well as establishing an assessment and plan that was discussed with the patient. > 50% of time was spent in direct patient care.    Thayer Headings, Brooke Bonito., MD, [facility] 08/18/2016, 9:37 AM [address]  [place] Pager [phone]

## 2016-08-18 NOTE — Discharge Summary (Signed)
Discharge Summary    Patient ID: Rhonda Allen,  MRN: [id number], DOB/AGE: September 18, 1956 60 y.o.  Admit date: 08/14/2016 Discharge date: 08/18/2016  Primary Care Provider: Precious Reel Primary Cardiologist: Dr. Domenic Polite [facility])   Discharge Diagnoses    Principal Problem:   Non-STEMI (non-ST elevated myocardial infarction) [facility]) Active Problems:   Essential hypertension   Insulin dependent type 2 diabetes mellitus, controlled ([place])   NSTEMI (non-ST elevated myocardial infarction) ([place])   Allergies Allergies  Allergen Reactions  . Nickel Rash    Diagnostic Studies/Procedures    Procedures   Coronary Stent Intervention  Intravascular Ultrasound/IVUS  Left Heart Cath and Coronary Angiography  Conclusion   1. Severe single-vessel coronary artery disease with 99% stenosis of the proximal LAD. 2. Successful IVUS-guided PCI to the proximal LAD with placement of an Integrity Resolute 3.5 x 15 mm drug-eluting stent with 0% residual stenosis and TIMI-3 flow. 3. Upper normal left ventricular filling pressure.    2D Echo  Study Conclusions  - Left ventricle: The cavity size was normal. Wall thickness was normal. Systolic function was mildly to moderately reduced. The estimated ejection fraction was in the range of 40% to 45%. There is akinesis of the mid-apicalanteroseptal, anterolateral, and apical myocardium. - Pulmonary arteries: Systolic pressure was mildly to moderately increased. PA peak pressure: 42 mm Hg (S).   History of Present Illness / Hospital Course    60 y/o insulin dependent, Type 2 diabetic who was admitted to [facility] on 08/14/16 with chest pain and ruled in for NSTEMI. Troponin peaked at 2.25. She was placed on IV heparin and was referred for [facility] on 08/17/16. Access was obtained via the right radial artery. She was found to have severe single-vessel coronary artery disease with 99% stenosis of the proximal LAD. She underwent successful  IVUS-guided PCI to the proximal LAD with placement of an Integrity Resolute 3.5 x 15 mm drug-eluting stent with 0% residual stenosis and TIMI-3 flow. No residual CAD. EF was mildy reduced at 40-45% by echo. She was placed on DAPT with ASA + Brilinta. High intensity statin and low dose BB was initiated. FLP demonstrated an LDL level of 128 mg/dL. Her home lisinopril was continued. She had no recurrent CP. No post cath complications. Her renal function and vital signs remained stable. She had no difficulties ambulating with cardiac rehab. She was last seen and examined by Dr. Acie Fredrickson who determined she was stable for d/c home. She was instructed to stay out of work until 08/24/16. Patient was given written work excuse. Post hospital f/u has been arranged in 2 weeks on 9/5 with Jory Sims, NP. She will resume long term follow-up in [place] with Dr. Domenic Polite, who also treated her during her hospitalization.    Consultants: None     Discharge Vitals Blood pressure (!) 121/55, pulse 93, temperature 98.5 F (36.9 C), temperature source Oral, resp. rate 16, height 5\' 8"  (1.727 m), weight 158 lb 8.2 oz (71.9 kg), SpO2 95 %.  Filed Weights   08/15/16 0012 08/16/16 2140 08/18/16 0710  Weight: 157 lb 13.6 oz (71.6 kg) 156 lb 9.6 oz (71 kg) 158 lb 8.2 oz (71.9 kg)    Labs & Radiologic Studies    CBC  Recent Labs  08/17/16 0321 08/18/16 0329  WBC 5.6 6.3  HGB 11.4* 12.0  HCT 35.5* 37.0  MCV 96.2 95.6  PLT 157 0000000   Basic Metabolic Panel  Recent Labs  08/16/16 0236 08/18/16 0329  NA 140 136  K 3.9 3.8  CL 106 104  CO2 24 23  GLUCOSE 174* 171*  BUN 13 11  CREATININE 0.75 0.86  CALCIUM 8.8* 9.0   Liver Function Tests No results for input(s): AST, ALT, ALKPHOS, BILITOT, PROT, ALBUMIN in the last 72 hours. No results for input(s): LIPASE, AMYLASE in the last 72 hours. Cardiac Enzymes  Recent Labs  08/17/16 0321  TROPONINI 2.25*   BNP Invalid input(s): POCBNP D-Dimer No  results for input(s): DDIMER in the last 72 hours. Hemoglobin A1C No results for input(s): HGBA1C in the last 72 hours. Fasting Lipid Panel No results for input(s): CHOL, HDL, LDLCALC, TRIG, CHOLHDL, LDLDIRECT in the last 72 hours. Thyroid Function Tests No results for input(s): TSH, T4TOTAL, T3FREE, THYROIDAB in the last 72 hours.  Invalid input(s): FREET3 _____________  Dg Chest 2 View  Result Date: 08/14/2016 CLINICAL DATA:  Initial evaluation for intermittent chest pain for 1 week. EXAM: CHEST  2 VIEW COMPARISON:  None. FINDINGS: The cardiac and mediastinal silhouettes are within normal limits. Lungs are normally inflated. Irregular biapical pleural parenchymal scarring with architectural distortion. No focal infiltrates. No pulmonary edema or pleural effusion. No pneumothorax. No acute osseous abnormality. IMPRESSION: 1. Chronic biapical pleural parenchymal scarring/architectural distortion. 2. No other active cardiopulmonary disease. Electronically Signed   By: Jeannine Boga M.D.   On: 08/14/2016 17:58   Disposition   Pt is being discharged home today in good condition.  Follow-up Plans & Appointments    Follow-up Information    Jory Sims, NP Follow up on 09/01/2016.   Specialties:  Nurse Practitioner, Radiology, Cardiology Why:  2:50 PM Cardiology follow-up  Contact information: [place] [address] [phone]          Discharge Instructions    Amb Referral to Cardiac Rehabilitation    Complete by:  As directed   Diagnosis:   PTCA NSTEMI Coronary Stents        Discharge Medications   Current Discharge Medication List    START taking these medications   Details  atorvastatin (LIPITOR) 80 MG tablet Take 1 tablet (80 mg total) by mouth daily at 6 PM. Qty: 30 tablet, Refills: 5    metoprolol tartrate (LOPRESSOR) 25 MG tablet Take 0.5 tablets (12.5 mg total) by mouth 2 (two) times daily. Qty: 60 tablet, Refills: 5    nitroGLYCERIN  (NITROSTAT) 0.4 MG SL tablet Place 1 tablet (0.4 mg total) under the tongue every 5 (five) minutes as needed for chest pain. Qty: 25 tablet, Refills: 2    !! ticagrelor (BRILINTA) 90 MG TABS tablet Take 1 tablet (90 mg total) by mouth 2 (two) times daily. Qty: 60 tablet, Refills: 10    !! ticagrelor (BRILINTA) 90 MG TABS tablet Take 1 tablet (90 mg total) by mouth 2 (two) times daily. Qty: 60 tablet, Refills: 0     !! - Potential duplicate medications found. Please discuss with provider.    CONTINUE these medications which have NOT CHANGED   Details  ACCU-CHEK AVIVA PLUS test strip     aspirin 81 MG tablet Take 81 mg by mouth daily.      B-D ULTRAFINE III SHORT PEN 31G X 8 MM MISC     BD INSULIN SYRINGE ULTRAFINE 31G X 5/16" 0.3 ML MISC     Ferrous Sulfate (IRON) 325 (65 FE) MG TABS Take 1 tablet by mouth every Monday, Wednesday, and Friday.     insulin glargine (LANTUS) 100 UNIT/ML injection Inject 18-19 Units into  the skin at bedtime.     insulin lispro (HUMALOG) 100 UNIT/ML injection Inject 8-15 Units into the skin 3 (three) times daily before meals. Per sliding scale    lisinopril (PRINIVIL,ZESTRIL) 10 MG tablet Take 10 mg by mouth daily.      Multiple Vitamin (MULTIVITAMIN) tablet Take 1 tablet by mouth every Monday, Wednesday, and Friday.     Omega-3 Fatty Acids (FISH OIL PO) Take 1 capsule by mouth See admin instructions. Once to twice daily    omeprazole (PRILOSEC) 40 MG capsule TAKE ONE CAPSULE BY MOUTH DAILY Qty: 30 capsule, Refills: 2    polycarbophil (FIBERCON) 625 MG tablet Take 625 mg by mouth daily.         Aspirin prescribed at discharge?  Yes High Intensity Statin Prescribed? (Lipitor 40-80mg  or Crestor 20-40mg ): Yes Beta Blocker Prescribed? Yes For EF <40%, was ACEI/ARB Prescribed? Yes ADP Receptor Inhibitor Prescribed? (i.e. Plavix etc.-Includes Medically Managed Patients): Yes For EF <40%, Aldosterone Inhibitor Prescribed? No: EF >40% Was EF assessed  during THIS hospitalization? Yes Was Cardiac Rehab II ordered? (Included Medically managed Patients): Yes   Outstanding Labs/Studies   None   Duration of Discharge Encounter   Greater than 30 minutes including physician time.  Signed, Lyda Jester PA-C 08/18/2016, 10:50 AM  Attending Note:    The patient was seen and examined.  Agree with assessment and plan as noted above.  Changes made to the above note as needed.  Patient seen and independently examined with Lyda Jester, PA .   We discussed all aspects of the encounter. I agree with the assessment and plan as stated above.  Pt has done well following PCI.  Has requested that we sign short term leave of absence papers for her work  Admitted on 08/14/16 Will return to work on 08/24/16  Advised her to not lift anything heavy with right hand Ok to walk Encouraged her to participate in cardiac rehab She will follow up with our [place] office.   DC on brilinta and ASA Lipitor for hyperlipidemia  Metoprolol 12. 5 bID has been added.     I have spent a total of 40 minutes with patient reviewing hospital  notes , telemetry, EKGs, labs and examining patient as well as establishing an assessment and plan that was discussed with the patient. > 50% of time was spent in direct patient care. Thayer Headings, Brooke Bonito., MD, [facility] 08/20/2016, 1:37 PM [address]  [place] Pager [phone]

## 2016-09-01 ENCOUNTER — Ambulatory Visit (INDEPENDENT_AMBULATORY_CARE_PROVIDER_SITE_OTHER): Payer: 59 | Admitting: Adult Health

## 2016-09-01 ENCOUNTER — Encounter: Payer: Self-pay | Admitting: Adult Health

## 2016-09-01 VITALS — BP 110/60 | HR 75 | Ht 68.0 in | Wt 154.0 lb

## 2016-09-01 DIAGNOSIS — I2510 Atherosclerotic heart disease of native coronary artery without angina pectoris: Secondary | ICD-10-CM

## 2016-09-01 DIAGNOSIS — E7800 Pure hypercholesterolemia, unspecified: Secondary | ICD-10-CM

## 2016-09-01 DIAGNOSIS — I10 Essential (primary) hypertension: Secondary | ICD-10-CM | POA: Diagnosis not present

## 2016-09-01 NOTE — Progress Notes (Signed)
Cardiology Office Note   Date:  09/01/2016   ID:  Rhonda Allen, Rhonda Allen 1956/11/20, MRN [id number]  PCP:  Rhonda Reel, MD  Cardiologist:Rhonda Allen/  Rhonda Sims, NP   No chief complaint on file.     History of Present Illness: Rhonda Allen is a 60 y.o. female who presents for ongoing assessment and management of coronary artery disease with 99% stenosis of the proximal LAD per cardiac catheterization on 08/18/2016. The patient had a successful IVIS guided PCI to the proximal LAD with placement of a Resolute 3.5 x 15 mm drug-eluting stent. Echocardiogram revealed EF of 4045% with akinesis of the mid mid-apicalanteroseptal, anterolateral, and apical myocardium.Pulmonary arteries: Systolic pressure was mildly to moderately increased. PA peak pressure: 42 mm Hg (S).  On discharge the patient was started on Brilinta, aspirin, high intensity statin and low-dose beta blocker.. She is here for post hospitalization follow-up.  She is here today without any further cardiac complaints. Her anginal equivalent is substernal chest pain with profound fatigue. The patient had been walking and had chest discomfort that returned even after resting. She has been medically compliant with dual antiplatelet therapy, is anxious to return to work. She is back walking a mile and a half a day. She denies any associated symptoms of dyspnea or fatigue.   Past Medical History:  Diagnosis Date  . Anemia   . Diabetes mellitus   . Diverticulosis of colon   . Frozen shoulder    right  . GERD (gastroesophageal reflux disease)   . Hemorrhoids   . Hypertension     Past Surgical History:  Procedure Laterality Date  . CARDIAC CATHETERIZATION N/A 08/17/2016   Procedure: Left Heart Cath and Coronary Angiography;  Surgeon: Rhonda Bush, MD;  Location: [place] [facility];  Service: Cardiovascular;  Laterality: N/A;  . CARDIAC CATHETERIZATION N/A 08/17/2016   Procedure: Coronary Stent Intervention;   Surgeon: Rhonda Bush, MD;  Location: [place] [facility];  Service: Cardiovascular;  Laterality: N/A;  . CARDIAC CATHETERIZATION N/A 08/17/2016   Procedure: Intravascular Ultrasound/IVUS;  Surgeon: Rhonda Bush, MD;  Location: [place] [facility];  Service: Cardiovascular;  Laterality: N/A;  . DILATION AND CURETTAGE OF UTERUS    . SHOULDER SURGERY     bone spur left shoulder  . WISDOM TOOTH EXTRACTION       Current Outpatient Prescriptions  Medication Sig Dispense Refill  . ACCU-CHEK AVIVA PLUS test strip     . aspirin 81 MG tablet Take 81 mg by mouth daily.      Marland Kitchen atorvastatin (LIPITOR) 80 MG tablet Take 1 tablet (80 mg total) by mouth daily at 6 PM. 30 tablet 5  . B-D ULTRAFINE III SHORT PEN 31G X 8 MM MISC     . BD INSULIN SYRINGE ULTRAFINE 31G X 5/16" 0.3 ML MISC     . Ferrous Sulfate (IRON) 325 (65 FE) MG TABS Take 1 tablet by mouth every Monday, Wednesday, and Friday.     . insulin glargine (LANTUS) 100 UNIT/ML injection Inject 18-19 Units into the skin at bedtime.     . insulin lispro (HUMALOG) 100 UNIT/ML injection Inject 8-15 Units into the skin 3 (three) times daily before meals. Per sliding scale    . lisinopril (PRINIVIL,ZESTRIL) 10 MG tablet Take 10 mg by mouth daily.      . metoprolol tartrate (LOPRESSOR) 25 MG tablet Take 0.5 tablets (12.5 mg total) by mouth 2 (two) times daily. 60 tablet 5  . Multiple Vitamin (MULTIVITAMIN) tablet  Take 1 tablet by mouth every Monday, Wednesday, and Friday.     . nitroGLYCERIN (NITROSTAT) 0.4 MG SL tablet Place 1 tablet (0.4 mg total) under the tongue every 5 (five) minutes as needed for chest pain. 25 tablet 2  . Omega-3 Fatty Acids (FISH OIL PO) Take 1 capsule by mouth See admin instructions. Once to twice daily    . omeprazole (PRILOSEC) 40 MG capsule TAKE ONE CAPSULE BY MOUTH DAILY 30 capsule 2  . polycarbophil (FIBERCON) 625 MG tablet Take 625 mg by mouth daily.    . ticagrelor (BRILINTA) 90 MG TABS tablet Take 1 tablet (90 mg  total) by mouth 2 (two) times daily. 60 tablet 10   No current facility-administered medications for this visit.     Allergies:   Nickel    Social History:  The patient  reports that she has never smoked. She has never used smokeless tobacco. She reports that she does not drink alcohol or use drugs.   Family History:  The patient's family history includes Irritable bowel syndrome in her mother.    ROS: All other systems are reviewed and negative. Unless otherwise mentioned in H&P    PHYSICAL EXAM: VS:  BP 110/60 (BP Location: Right Arm)   Pulse 75   Ht 5\' 8"  (1.727 m)   Wt 154 lb (69.9 kg)   SpO2 98%   BMI 23.42 kg/m  , BMI Body mass index is 23.42 kg/m. GEN: Well nourished, well developed, in no acute distress  HEENT: normal  Neck: no JVD, carotid bruits, or masses Cardiac: RRR; no murmurs, rubs, or gallops,no edema  Respiratory:  clear to auscultation bilaterally, normal work of breathing GI: soft, nontender, nondistended, + BS MS: no deformity or atrophy Cardiac cath insertion site of the right wrist is clean and dry without evidence of infection or hematoma Skin: warm and dry, no rash Neuro:  Strength and sensation are intact Psych: euthymic mood, full affect   Recent Labs: 08/14/2016: ALT 23 08/18/2016: BUN 11; Creatinine, Ser 0.86; Hemoglobin 12.0; Platelets 158; Potassium 3.8; Sodium 136    Lipid Panel    Component Value Date/Time   CHOL 216 (H) 08/15/2016 0433   TRIG 40 08/15/2016 0433   HDL 80 08/15/2016 0433   CHOLHDL 2.7 08/15/2016 0433   VLDL 8 08/15/2016 0433   LDLCALC 128 (H) 08/15/2016 0433      Wt Readings from Last 3 Encounters:  09/01/16 154 lb (69.9 kg)  08/18/16 158 lb 8.2 oz (71.9 kg)  12/18/14 153 lb (69.4 kg)      ASSESSMENT AND PLAN:  1. Coronary artery disease: Status post drug-eluting stent to the proximal LAD. She will continue on dual antiplatelet therapy, statin, metoprolol, and lisinopril. I provided her with a diagram of her  cardiac catheterization results and stent placement. She is interested in cardiac rehabilitation. She is willing to do this around her work schedule. She is given a letter to return to work on Tuesday, September 12. 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 We'll see her again in 3 months for close follow-up.  2. Hypercholesterolemia: She will continue statin therapy.  3. Hypertension: Continue ACE inhibitor along with beta blocker. Close follow-up 3 months.   Current medicines are reviewed at length with the patient today.    Labs/ tests ordered today include:  No orders of the defined types were placed in this encounter.    Disposition:   FU with  3 months Signed, Rhonda Sims, NP  09/01/2016 4:52 PM    [facility] [address] [place], [place] [address] Phone: [phone]; Fax: [phone]

## 2016-09-01 NOTE — Progress Notes (Signed)
Name: Rhonda Allen    DOB: [birth year]  Age: 60 y.o.  MR#: [id number]       PCP:  Precious Reel, MD      Insurance: Payor: [employer] / Plan: [employer] OTHER / Product Type: *No Product type* /   CC:   No chief complaint on file.   VS Vitals:   09/01/16 1454 09/01/16 1459  BP: (!) 112/58 110/60  Pulse: 75   SpO2: 98%   Weight: 154 lb (69.9 kg) 154 lb (69.9 kg)  Height: 5\' 8"  (1.727 m) 5\' 8"  (1.727 m)    Weights Current Weight  09/01/16 154 lb (69.9 kg)  08/18/16 158 lb 8.2 oz (71.9 kg)  12/18/14 153 lb (69.4 kg)    Blood Pressure  BP Readings from Last 3 Encounters:  09/01/16 110/60  08/18/16 (!) 121/55  12/18/14 110/60     Admit date:  (Not on file) Last encounter with RMR:  Visit date not found   Allergy Nickel  Current Outpatient Prescriptions  Medication Sig Dispense Refill  . ACCU-CHEK AVIVA PLUS test strip     . aspirin 81 MG tablet Take 81 mg by mouth daily.      Marland Kitchen atorvastatin (LIPITOR) 80 MG tablet Take 1 tablet (80 mg total) by mouth daily at 6 PM. 30 tablet 5  . B-D ULTRAFINE III SHORT PEN 31G X 8 MM MISC     . BD INSULIN SYRINGE ULTRAFINE 31G X 5/16" 0.3 ML MISC     . Ferrous Sulfate (IRON) 325 (65 FE) MG TABS Take 1 tablet by mouth every Monday, Wednesday, and Friday.     . insulin glargine (LANTUS) 100 UNIT/ML injection Inject 18-19 Units into the skin at bedtime.     . insulin lispro (HUMALOG) 100 UNIT/ML injection Inject 8-15 Units into the skin 3 (three) times daily before meals. Per sliding scale    . lisinopril (PRINIVIL,ZESTRIL) 10 MG tablet Take 10 mg by mouth daily.      . metoprolol tartrate (LOPRESSOR) 25 MG tablet Take 0.5 tablets (12.5 mg total) by mouth 2 (two) times daily. 60 tablet 5  . Multiple Vitamin (MULTIVITAMIN) tablet Take 1 tablet by mouth every Monday, Wednesday, and Friday.     . nitroGLYCERIN (NITROSTAT) 0.4 MG SL tablet Place 1 tablet (0.4 mg total) under the tongue every 5 (five) minutes as needed for chest  pain. 25 tablet 2  . Omega-3 Fatty Acids (FISH OIL PO) Take 1 capsule by mouth See admin instructions. Once to twice daily    . omeprazole (PRILOSEC) 40 MG capsule TAKE ONE CAPSULE BY MOUTH DAILY 30 capsule 2  . polycarbophil (FIBERCON) 625 MG tablet Take 625 mg by mouth daily.    . ticagrelor (BRILINTA) 90 MG TABS tablet Take 1 tablet (90 mg total) by mouth 2 (two) times daily. 60 tablet 10  . ticagrelor (BRILINTA) 90 MG TABS tablet Take 1 tablet (90 mg total) by mouth 2 (two) times daily. 60 tablet 0   No current facility-administered medications for this visit.     Discontinued Meds:   There are no discontinued medications.  Patient Active Problem List   Diagnosis Date Noted  . Non-STEMI (non-ST elevated myocardial infarction) ([place]) 08/14/2016  . Insulin dependent type 2 diabetes mellitus, controlled ([place]) 08/14/2016  . NSTEMI (non-ST elevated myocardial infarction) ([place]) 08/14/2016  . Essential hypertension     LABS    Component Value Date/Time   NA 136 08/18/2016 0329   NA 140  08/16/2016 0236   NA 141 08/15/2016 0433   K 3.8 08/18/2016 0329   K 3.9 08/16/2016 0236   K 3.9 08/15/2016 0433   CL 104 08/18/2016 0329   CL 106 08/16/2016 0236   CL 110 08/15/2016 0433   CO2 23 08/18/2016 0329   CO2 24 08/16/2016 0236   CO2 25 08/15/2016 0433   GLUCOSE 171 (H) 08/18/2016 0329   GLUCOSE 174 (H) 08/16/2016 0236   GLUCOSE 136 (H) 08/15/2016 0433   BUN 11 08/18/2016 0329   BUN 13 08/16/2016 0236   BUN 12 08/15/2016 0433   CREATININE 0.86 08/18/2016 0329   CREATININE 0.75 08/16/2016 0236   CREATININE 0.75 08/15/2016 0433   CALCIUM 9.0 08/18/2016 0329   CALCIUM 8.8 (L) 08/16/2016 0236   CALCIUM 8.7 (L) 08/15/2016 0433   GFRNONAA >60 08/18/2016 0329   GFRNONAA >60 08/16/2016 0236   GFRNONAA >60 08/15/2016 0433   GFRAA >60 08/18/2016 0329   GFRAA >60 08/16/2016 0236   GFRAA >60 08/15/2016 0433   CMP     Component Value Date/Time   NA 136 08/18/2016 0329   K 3.8  08/18/2016 0329   CL 104 08/18/2016 0329   CO2 23 08/18/2016 0329   GLUCOSE 171 (H) 08/18/2016 0329   BUN 11 08/18/2016 0329   CREATININE 0.86 08/18/2016 0329   CALCIUM 9.0 08/18/2016 0329   PROT 7.2 08/14/2016 1659   ALBUMIN 4.1 08/14/2016 1659   AST 31 08/14/2016 1659   ALT 23 08/14/2016 1659   ALKPHOS 68 08/14/2016 1659   BILITOT 0.9 08/14/2016 1659   GFRNONAA >60 08/18/2016 0329   GFRAA >60 08/18/2016 0329       Component Value Date/Time   WBC 6.3 08/18/2016 0329   WBC 5.6 08/17/2016 0321   WBC 6.3 08/16/2016 0236   HGB 12.0 08/18/2016 0329   HGB 11.4 (L) 08/17/2016 0321   HGB 11.9 (L) 08/16/2016 0236   HCT 37.0 08/18/2016 0329   HCT 35.5 (L) 08/17/2016 0321   HCT 37.4 08/16/2016 0236   MCV 95.6 08/18/2016 0329   MCV 96.2 08/17/2016 0321   MCV 97.4 08/16/2016 0236    Lipid Panel     Component Value Date/Time   CHOL 216 (H) 08/15/2016 0433   TRIG 40 08/15/2016 0433   HDL 80 08/15/2016 0433   CHOLHDL 2.7 08/15/2016 0433   VLDL 8 08/15/2016 0433   LDLCALC 128 (H) 08/15/2016 0433    ABG No results found for: PHART, PCO2ART, PO2ART, HCO3, TCO2, ACIDBASEDEF, O2SAT   No results found for: TSH BNP (last 3 results) No results for input(s): BNP in the last 8760 hours.  ProBNP (last 3 results) No results for input(s): PROBNP in the last 8760 hours.  Cardiac Panel (last 3 results) No results for input(s): CKTOTAL, CKMB, TROPONINI, RELINDX in the last 72 hours.  Iron/TIBC/Ferritin/ %Sat No results found for: IRON, TIBC, FERRITIN, IRONPCTSAT   EKG Orders placed or performed during the hospital encounter of 08/14/16  . ED EKG  . ED EKG  . EKG 12-Lead  . EKG 12-Lead  . EKG 12-Lead  . EKG 12-Lead  . EKG 12-Lead  . EKG 12-Lead  . EKG 12-Lead  . EKG 12-Lead  . EKG 12-Lead  . EKG 12-Lead immediately post procedure  . EKG 12-Lead  . EKG 12-Lead  . EKG 12-Lead  . EKG 12-Lead immediately post procedure  . EKG 12-Lead     Prior Assessment and Plan Problem  List as of 09/01/2016 Reviewed:  12/18/2014 10:05 AM by Scarlette Shorts, MD     Cardiovascular and Mediastinum   Essential hypertension   Non-STEMI (non-ST elevated myocardial infarction) [facility])   NSTEMI (non-ST elevated myocardial infarction) ([place])     Endocrine   Insulin dependent type 2 diabetes mellitus, controlled [facility])       Imaging: Dg Chest 2 View  Result Date: 08/14/2016 CLINICAL DATA:  Initial evaluation for intermittent chest pain for 1 week. EXAM: CHEST  2 VIEW COMPARISON:  None. FINDINGS: The cardiac and mediastinal silhouettes are within normal limits. Lungs are normally inflated. Irregular biapical pleural parenchymal scarring with architectural distortion. No focal infiltrates. No pulmonary edema or pleural effusion. No pneumothorax. No acute osseous abnormality. IMPRESSION: 1. Chronic biapical pleural parenchymal scarring/architectural distortion. 2. No other active cardiopulmonary disease. Electronically Signed   By: Jeannine Boga M.D.   On: 08/14/2016 17:58

## 2016-09-01 NOTE — Patient Instructions (Signed)
Your physician recommends that you continue on your current medications as directed. Please refer to the Current Medication list given to you today.   Your physician recommends that you schedule a follow-up appointment in: 3 months    Thanks for choosing [facility]!!!

## 2016-09-04 ENCOUNTER — Telehealth: Payer: Self-pay | Admitting: Adult Health

## 2016-09-04 ENCOUNTER — Encounter: Payer: Self-pay | Admitting: *Deleted

## 2016-09-07 NOTE — Telephone Encounter (Signed)
Kisha sent Jody a new letter on Friday for pt to return to work without restrictions.

## 2016-09-18 ENCOUNTER — Encounter (HOSPITAL_COMMUNITY)
Admission: RE | Admit: 2016-09-18 | Discharge: 2016-09-18 | Disposition: A | Discharge status: Home / Self Care | Payer: 59 | Source: Ambulatory Visit | Attending: Cardiology | Admitting: Cardiology

## 2016-09-18 ENCOUNTER — Encounter (HOSPITAL_COMMUNITY): Payer: Self-pay

## 2016-09-18 VITALS — BP 120/58 | HR 96 | Ht 68.0 in | Wt 154.4 lb

## 2016-09-18 DIAGNOSIS — I214 Non-ST elevation (NSTEMI) myocardial infarction: Secondary | ICD-10-CM | POA: Diagnosis present

## 2016-09-18 DIAGNOSIS — Z955 Presence of coronary angioplasty implant and graft: Secondary | ICD-10-CM

## 2016-09-18 NOTE — Progress Notes (Signed)
Cardiac/Pulmonary Rehab Medication Review by a Pharmacist  Does the patient  feel that his/her medications are working for him/her?  yes  Has the patient been experiencing any side effects to the medications prescribed?  no  Does the patient measure his/her own blood pressure or blood glucose at home?  yes   Does the patient have any problems obtaining medications due to transportation or finances?   no  Understanding of regimen: excellent Understanding of indications: excellent Potential of compliance: excellent  Pharmacist comments: Very pleasant 60 yo female with recent intervention, status post drug-eluting stent to the proximal LAD. Medications were reviewed and patient understood them very well. She monitors her Blood sugars 3-4 times per day. Recommended monitoring blood pressure and heart rate several times per week or if feel lightheaded, dizzy. Patient is tolerating medications without any problems. Continue with current regimen.  Thanks for the opportunity to participate in the care of this patient,  Isac Sarna, BS [place], [place] Clinical Pharmacist Pager [phone] 09/18/2016 1:54 PM

## 2016-09-18 NOTE — Progress Notes (Signed)
Cardiac Individual Treatment Plan  Patient Details  Name: Rhonda Allen MRN: [id number] Date of Birth: 04-25-1956 Referring Provider:   Flowsheet Row CARDIAC REHAB PHASE II ORIENTATION from 09/18/2016 in [place]  Referring Provider  Dr. Domenic Polite      Initial Encounter Date:  Flowsheet Row CARDIAC REHAB PHASE II ORIENTATION from 09/18/2016 in [place]  Date  09/18/16  Referring Provider  Dr. Domenic Polite      Visit Diagnosis: NSTEMI (non-ST elevated myocardial infarction) [facility])  Status post coronary artery stent placement  Patient's Home Medications on Admission:  Current Outpatient Prescriptions:  .  ACCU-CHEK AVIVA PLUS test strip, , Disp: , Rfl:  .  aspirin 81 MG tablet, Take 81 mg by mouth daily.  , Disp: , Rfl:  .  atorvastatin (LIPITOR) 80 MG tablet, Take 1 tablet (80 mg total) by mouth daily at 6 PM., Disp: 30 tablet, Rfl: 5 .  B-D ULTRAFINE III SHORT PEN 31G X 8 MM MISC, , Disp: , Rfl:  .  BD INSULIN SYRINGE ULTRAFINE 31G X 5/16" 0.3 ML MISC, , Disp: , Rfl:  .  Ferrous Sulfate (IRON) 325 (65 FE) MG TABS, Take 1 tablet by mouth every Monday, Wednesday, and Friday. , Disp: , Rfl:  .  insulin glargine (LANTUS) 100 UNIT/ML injection, Inject 18-19 Units into the skin at bedtime. , Disp: , Rfl:  .  insulin lispro (HUMALOG) 100 UNIT/ML injection, Inject 8-15 Units into the skin 3 (three) times daily before meals. Per sliding scale, Disp: , Rfl:  .  lisinopril (PRINIVIL,ZESTRIL) 10 MG tablet, Take 10 mg by mouth daily.  , Disp: , Rfl:  .  metoprolol tartrate (LOPRESSOR) 25 MG tablet, Take 0.5 tablets (12.5 mg total) by mouth 2 (two) times daily., Disp: 60 tablet, Rfl: 5 .  Multiple Vitamin (MULTIVITAMIN) tablet, Take 1 tablet by mouth every Monday, Wednesday, and Friday. , Disp: , Rfl:  .  nitroGLYCERIN (NITROSTAT) 0.4 MG SL tablet, Place 1 tablet (0.4 mg total) under the tongue every 5 (five) minutes as needed for chest pain.,  Disp: 25 tablet, Rfl: 2 .  Omega-3 Fatty Acids (FISH OIL PO), Take 1 capsule by mouth See admin instructions. Once to twice daily, Disp: , Rfl:  .  omeprazole (PRILOSEC) 40 MG capsule, TAKE ONE CAPSULE BY MOUTH DAILY, Disp: 30 capsule, Rfl: 2 .  polycarbophil (FIBERCON) 625 MG tablet, Take 625 mg by mouth daily., Disp: , Rfl:  .  ticagrelor (BRILINTA) 90 MG TABS tablet, Take 1 tablet (90 mg total) by mouth 2 (two) times daily., Disp: 60 tablet, Rfl: 10  Past Medical History: Past Medical History:  Diagnosis Date  . Anemia   . Diabetes mellitus   . Diverticulosis of colon   . Frozen shoulder    right  . GERD (gastroesophageal reflux disease)   . Hemorrhoids   . Hypertension     Tobacco Use: History  Smoking Status  . Never Smoker  Smokeless Tobacco  . Never Used    Labs: Recent Review Flowsheet Data    Labs for ITP Cardiac and Pulmonary Rehab Latest Ref Rng & Units 08/15/2016   Cholestrol 0 - 200 mg/dL 216(H)   LDLCALC 0 - 99 mg/dL 128(H)   HDL >40 mg/dL 80   Trlycerides <150 mg/dL 40      Capillary Blood Glucose: Lab Results  Component Value Date   GLUCAP 285 (H) 08/18/2016   GLUCAP 131 (H) 08/18/2016   GLUCAP 180 (H) 08/17/2016  GLUCAP 122 (H) 08/17/2016   GLUCAP 138 (H) 08/17/2016     Exercise Target Goals: Date: 09/18/16  Exercise Program Goal: Individual exercise prescription set with THRR, safety & activity barriers. Participant demonstrates ability to understand and report RPE using BORG scale, to self-measure pulse accurately, and to acknowledge the importance of the exercise prescription.  Exercise Prescription Goal: Starting with aerobic activity 30 plus minutes a day, 3 days per week for initial exercise prescription. Provide home exercise prescription and guidelines that participant acknowledges understanding prior to discharge.  Activity Barriers & Risk Stratification:     Activity Barriers & Cardiac Risk Stratification - 09/18/16 1435       Activity Barriers & Cardiac Risk Stratification   Activity Barriers None   Cardiac Risk Stratification High      6 Minute Walk:     6 Minute Walk    Row Name 09/18/16 1419         6 Minute Walk   Phase Initial     Distance 1650 feet     Walk Time 6 minutes     # of Rest Breaks 0     MPH 3.12     METS 3.39     RPE 10     Perceived Dyspnea  10     VO2 Peak 16.09     Symptoms No     Resting HR 68 bpm     Resting BP 120/58     Max Ex. HR 110 bpm     Max Ex. BP 154/76     2 Minute Post BP 132/72        Initial Exercise Prescription:     Initial Exercise Prescription - 09/18/16 1400      Date of Initial Exercise RX and Referring Provider   Date 09/18/16   Referring Provider Dr. Domenic Polite     Treadmill   MPH 1.3   Grade 0   Minutes 15   METs 1.9     NuStep   Level 2   Watts 15   Minutes 20   METs 1.9     Prescription Details   Frequency (times per week) 3   Duration Progress to 30 minutes of continuous aerobic without signs/symptoms of physical distress     Intensity   THRR REST +  30   THRR 40-80% of Max Heartrate [phone]   Ratings of Perceived Exertion 11-13   Perceived Dyspnea 0-4     Progression   Progression Continue progressive overload as per policy without signs/symptoms or physical distress.     Resistance Training   Training Prescription Yes   Weight 1   Reps 10-12      Perform Capillary Blood Glucose checks as needed.  Exercise Prescription Changes:   Exercise Comments:    Discharge Exercise Prescription (Final Exercise Prescription Changes):   Nutrition:  Target Goals: Understanding of nutrition guidelines, daily intake of sodium 1500mg , cholesterol 200mg , calories 30% from fat and 7% or less from saturated fats, daily to have 5 or more servings of fruits and vegetables.  Biometrics:     Pre Biometrics - 09/18/16 1422      Pre Biometrics   Waist Circumference 32 inches   Hip Circumference 39.5 inches   Waist to  Hip Ratio 0.81 %   BMI (Calculated) 23.5   Triceps Skinfold 18 mm   % Body Fat 32.3 %   Grip Strength 78 kg   Flexibility 14.16 in   Single Leg Stand 30  seconds       Nutrition Therapy Plan and Nutrition Goals:   Nutrition Discharge: Rate Your Plate Scores:     Nutrition Assessments - 09/18/16 1439      MEDFICTS Scores   Pre Score 21      Nutrition Goals Re-Evaluation:   Psychosocial: Target Goals: Acknowledge presence or absence of depression, maximize coping skills, provide positive support system. Participant is able to verbalize types and ability to use techniques and skills needed for reducing stress and depression.  Initial Review & Psychosocial Screening:     Initial Psych Review & Screening - 09/18/16 [place]? Yes     Barriers   Psychosocial barriers to participate in program --  No psychosocial barriers identified.      Screening Interventions   Interventions Encouraged to exercise  Patient is not depressed and does not need counseling.       Quality of Life Scores:     Quality of Life - 09/18/16 1423      Quality of Life Scores   Health/Function Pre 26.6 %   Socioeconomic Pre 27.06 %   Psych/Spiritual Pre 26.21 %   Family Pre 28.5 %   GLOBAL Pre 26.9 %      PHQ-9: Recent Review Flowsheet Data    Depression screen [facility] 2/9 09/18/2016   Decreased Interest 0   Down, Depressed, Hopeless 0   PHQ - 2 Score 0   Altered sleeping 1   Tired, decreased energy 0   Change in appetite 1   Feeling bad or failure about yourself  0   Trouble concentrating 0   Moving slowly or fidgety/restless 0   Suicidal thoughts 0   PHQ-9 Score 2   Difficult doing work/chores Not difficult at all      Psychosocial Evaluation and Intervention:     Psychosocial Evaluation - 09/18/16 1445      Psychosocial Evaluation & Interventions   Interventions Encouraged to exercise with the program and follow exercise  prescription      Psychosocial Re-Evaluation:   Vocational Rehabilitation: Provide vocational rehab assistance to qualifying candidates.   Vocational Rehab Evaluation & Intervention:     Vocational Rehab - 09/18/16 1438      Initial Vocational Rehab Evaluation & Intervention   Assessment shows need for Vocational Rehabilitation No      Education: Education Goals: Education classes will be provided on a weekly basis, covering required topics. Participant will state understanding/return demonstration of topics presented.  Learning Barriers/Preferences:     Learning Barriers/Preferences - 09/18/16 1436      Learning Barriers/Preferences   Learning Barriers None   Learning Preferences Verbal Instruction;Skilled Demonstration      Education Topics: Hypertension, Hypertension Reduction -Define heart disease and high blood pressure. Discus how high blood pressure affects the body and ways to reduce high blood pressure.   Exercise and Your Heart -Discuss why it is important to exercise, the FITT principles of exercise, normal and abnormal responses to exercise, and how to exercise safely.   Angina -Discuss definition of angina, causes of angina, treatment of angina, and how to decrease risk of having angina.   Cardiac Medications -Review what the following cardiac medications are used for, how they affect the body, and side effects that may occur when taking the medications.  Medications include Aspirin, Beta blockers, calcium channel blockers, ACE Inhibitors, angiotensin receptor blockers, diuretics, digoxin, and antihyperlipidemics.   Congestive Heart Failure -  Discuss the definition of CHF, how to live with CHF, the signs and symptoms of CHF, and how keep track of weight and sodium intake.   Heart Disease and Intimacy -Discus the effect sexual activity has on the heart, how changes occur during intimacy as we age, and safety during sexual activity.   Smoking  Cessation / COPD -Discuss different methods to quit smoking, the health benefits of quitting smoking, and the definition of COPD.   Nutrition I: Fats -Discuss the types of cholesterol, what cholesterol does to the heart, and how cholesterol levels can be controlled.   Nutrition II: Labels -Discuss the different components of food labels and how to read food label   Heart Parts and Heart Disease -Discuss the anatomy of the heart, the pathway of blood circulation through the heart, and these are affected by heart disease.   Stress I: Signs and Symptoms -Discuss the causes of stress, how stress may lead to anxiety and depression, and ways to limit stress.   Stress II: Relaxation -Discuss different types of relaxation techniques to limit stress.   Warning Signs of Stroke / TIA -Discuss definition of a stroke, what the signs and symptoms are of a stroke, and how to identify when someone is having stroke.   Knowledge Questionnaire Score:     Knowledge Questionnaire Score - 09/18/16 1438      Knowledge Questionnaire Score   Pre Score 26/28      Core Components/Risk Factors/Patient Goals at Admission:     Personal Goals and Risk Factors at Admission - 09/18/16 1439      Core Components/Risk Factors/Patient Goals on Admission    Weight Management Weight Maintenance   Increase Strength and Stamina Yes   Intervention Provide advice, education, support and counseling about physical activity/exercise needs.;Develop an individualized exercise prescription for aerobic and resistive training based on initial evaluation findings, risk stratification, comorbidities and participant's personal goals.   Expected Outcomes Achievement of increased cardiorespiratory fitness and enhanced flexibility, muscular endurance and strength shown through measurements of functional capacity and personal statement of participant.   Diabetes Yes   Intervention Provide education about signs/symptoms and  action to take for hypo/hyperglycemia.;Provide education about proper nutrition, including hydration, and aerobic/resistive exercise prescription along with prescribed medications to achieve blood glucose in normal ranges: Fasting glucose 65-99 mg/dL   Expected Outcomes Long Term: Attainment of HbA1C < 7%.   Lipids Yes   Intervention Provide education and support for participant on nutrition & aerobic/resistive exercise along with prescribed medications to achieve LDL 70mg , HDL >40mg .   Expected Outcomes Short Term: Participant states understanding of desired cholesterol values and is compliant with medications prescribed. Participant is following exercise prescription and nutrition guidelines.;Long Term: Cholesterol controlled with medications as prescribed, with individualized exercise RX and with personalized nutrition plan. Value goals: LDL < 70mg , HDL > 40 mg.   Personal Goal Other Yes   Personal Goal Learn more about diet and nutrition. Get on an exercise routine and live a long life.    Intervention Attend cardiac rehab sessions 3 days/week and supplement with exercise at home 2 days/week.   Expected Outcomes Patient will complete the program meeting the above stated goals.       Core Components/Risk Factors/Patient Goals Review:      Goals and Risk Factor Review    Row Name 09/18/16 1443             Core Components/Risk Factors/Patient Goals Review   Personal Goals Review Weight Management/Obesity;Increase Strength  and Stamina;Lipids;Diabetes          Core Components/Risk Factors/Patient Goals at Discharge (Final Review):      Goals and Risk Factor Review - 09/18/16 1443      Core Components/Risk Factors/Patient Goals Review   Personal Goals Review Weight Management/Obesity;Increase Strength and Stamina;Lipids;Diabetes      ITP Comments:   Comments: Patient arrived for 1st visit/orientation/education at 1230. Patient was referred to CR by Dr. Domenic Polite  due to NSTEMI  (I21.4) and Stent placement (Z95.5). During orientation advised patient on arrival and appointment times what to wear, what to do before, during and after exercise. Reviewed attendance and class policy. Talked about inclement weather and class consultation policy. Pt is scheduled to return to Cardiac Rehab on 09/21/16 at 3:45. Pt was advised to come to class 15 minutes before class starts. Patient was also given instructions on meeting with the dietician and attending the Family Structure classes. Pt is eager to get started. Patient was able to complete 6 minute walk test. Patient was measured for the equipment. Discussed equipment safety with patient. Took patient pre-anthropometric measurements. Patient finished visit at 1418.

## 2016-09-21 ENCOUNTER — Encounter (HOSPITAL_COMMUNITY)
Admission: RE | Admit: 2016-09-21 | Discharge: 2016-09-21 | Disposition: A | Discharge status: Home / Self Care | Payer: 59 | Source: Ambulatory Visit | Attending: Cardiology | Admitting: Cardiology

## 2016-09-21 DIAGNOSIS — Z955 Presence of coronary angioplasty implant and graft: Secondary | ICD-10-CM

## 2016-09-21 DIAGNOSIS — I214 Non-ST elevation (NSTEMI) myocardial infarction: Secondary | ICD-10-CM

## 2016-09-21 NOTE — Progress Notes (Signed)
Daily Session Note  Patient Details  Name: Rhonda Allen MRN: [id number] Date of Birth: Jun 03, 1956 Referring Provider:   Flowsheet Row CARDIAC REHAB PHASE II ORIENTATION from 09/18/2016 in [place]  Referring Provider  Dr. Domenic Polite      Encounter Date: 09/21/2016  Check In:     Session Check In - 09/21/16 1545      Check-In   Location [facility] Pul[facility] Re[facility]   Staff Present Darah Simkin Angelina Pih, MS, EP, [facility], Exercise Physiologist;Debra Wynetta Emery, RN, BSN   Supervising physician immediately available to respond to emergencies See telemetry face sheet for immediately available MD   Medication changes reported     No   Fall or balance concerns reported    No   Warm-up and Cool-down Not performed (comment)   Resistance Training Performed No   VAD Patient? No     Pain Assessment   Currently in Pain? No/denies   Pain Score 0-No pain   Multiple Pain Sites No      Capillary Blood Glucose: No results found for this or any previous visit (from the past 24 hour(s)).   Goals Met:  Independence with exercise equipment Exercise tolerated well No report of cardiac concerns or symptoms Strength training completed today  Goals Unmet:  Not Applicable  Comments: Check out: [place]   Dr. Kate Sable is Medical Director for [place] and Pulmonary Rehab.

## 2016-09-23 ENCOUNTER — Encounter (HOSPITAL_COMMUNITY)
Admission: RE | Admit: 2016-09-23 | Discharge: 2016-09-23 | Disposition: A | Discharge status: Home / Self Care | Payer: 59 | Source: Ambulatory Visit | Attending: Cardiology | Admitting: Cardiology

## 2016-09-23 DIAGNOSIS — I214 Non-ST elevation (NSTEMI) myocardial infarction: Secondary | ICD-10-CM | POA: Diagnosis not present

## 2016-09-23 DIAGNOSIS — Z955 Presence of coronary angioplasty implant and graft: Secondary | ICD-10-CM

## 2016-09-23 NOTE — Progress Notes (Signed)
Daily Session Note  Patient Details  Name: Rhonda Allen MRN: [id number] Date of Birth: 05-Oct-1956 Referring Provider:   Flowsheet Row CARDIAC REHAB PHASE II ORIENTATION from 09/18/2016 in [place]  Referring Provider  Dr. Domenic Polite      Encounter Date: 09/23/2016  Check In:     Session Check In - 09/23/16 1545      Check-In   Location [facility]   Staff Present Jalacia Mattila Angelina Pih, MS, EP, [facility], Exercise Physiologist;Debra Wynetta Emery, RN, BSN   Medication changes reported     No   Fall or balance concerns reported    No   Warm-up and Cool-down Not performed (comment)   Resistance Training Performed No   VAD Patient? No     Pain Assessment   Currently in Pain? No/denies   Pain Score 0-No pain   Multiple Pain Sites No      Capillary Blood Glucose: No results found for this or any previous visit (from the past 24 hour(s)).   Goals Met:  Independence with exercise equipment Exercise tolerated well No report of cardiac concerns or symptoms Strength training completed today  Goals Unmet:  Not Applicable  Comments: Check out: [place]   Dr. Kate Sable is Medical Director for [place] and Pulmonary Rehab.

## 2016-09-25 ENCOUNTER — Encounter (HOSPITAL_COMMUNITY)
Admission: RE | Admit: 2016-09-25 | Discharge: 2016-09-25 | Disposition: A | Discharge status: Home / Self Care | Payer: 59 | Source: Ambulatory Visit | Attending: Cardiology | Admitting: Cardiology

## 2016-09-25 DIAGNOSIS — I214 Non-ST elevation (NSTEMI) myocardial infarction: Secondary | ICD-10-CM

## 2016-09-25 DIAGNOSIS — Z955 Presence of coronary angioplasty implant and graft: Secondary | ICD-10-CM

## 2016-09-25 NOTE — Progress Notes (Signed)
Daily Session Note  Patient Details  Name: ENMA MAEDA MRN: [id number] Date of Birth: Jun 18, 1956 Referring Provider:   Flowsheet Row CARDIAC REHAB PHASE II ORIENTATION from 09/18/2016 in [place]  Referring Provider  Dr. Domenic Polite      Encounter Date: 09/25/2016  Check In:     Session Check In - 09/25/16 1545      Check-In   Location [facility]   Staff Present Jahmeek Shirk Angelina Pih, MS, EP, [facility], Exercise Physiologist;Debra Wynetta Emery, RN, BSN   Supervising physician immediately available to respond to emergencies See telemetry face sheet for immediately available MD   Medication changes reported     No   Fall or balance concerns reported    No   Warm-up and Cool-down Not performed (comment)   Resistance Training Performed No   VAD Patient? No     Pain Assessment   Currently in Pain? No/denies   Pain Score 0-No pain   Multiple Pain Sites No      Capillary Blood Glucose: No results found for this or any previous visit (from the past 24 hour(s)).   Goals Met:  Independence with exercise equipment Exercise tolerated well No report of cardiac concerns or symptoms Strength training completed today  Goals Unmet:  Not Applicable  Comments: Check out: [place]   Dr. Kate Sable is Medical Director for [place] and Pulmonary Rehab.

## 2016-09-28 ENCOUNTER — Encounter (HOSPITAL_COMMUNITY)
Admission: RE | Admit: 2016-09-28 | Discharge: 2016-09-28 | Disposition: A | Discharge status: Home / Self Care | Payer: 59 | Source: Ambulatory Visit | Attending: Cardiology | Admitting: Cardiology

## 2016-09-28 DIAGNOSIS — Z955 Presence of coronary angioplasty implant and graft: Secondary | ICD-10-CM | POA: Insufficient documentation

## 2016-09-28 DIAGNOSIS — I214 Non-ST elevation (NSTEMI) myocardial infarction: Secondary | ICD-10-CM | POA: Diagnosis present

## 2016-09-28 NOTE — Progress Notes (Signed)
Daily Session Note  Patient Details  Name: Rhonda Allen MRN: [id number] Date of Birth: 03/29/1956 Referring Provider:   Flowsheet Row CARDIAC REHAB PHASE II ORIENTATION from 09/18/2016 in [place]  Referring Provider  Dr. Domenic Polite      Encounter Date: 09/28/2016  Check In:     Session Check In - 09/28/16 1545      Check-In   Location [facility]   Staff Present Aundra Dubin, RN, BSN;Meiah Zamudio Luther Parody, BS, EP, Exercise Physiologist   Supervising physician immediately available to respond to emergencies See telemetry face sheet for immediately available MD   Medication changes reported     No   Fall or balance concerns reported    No   Warm-up and Cool-down Performed as group-led instruction   Resistance Training Performed Yes   VAD Patient? No     Pain Assessment   Currently in Pain? No/denies   Pain Score 0-No pain   Multiple Pain Sites No      Capillary Blood Glucose: No results found for this or any previous visit (from the past 24 hour(s)).   Goals Met:  Independence with exercise equipment Exercise tolerated well No report of cardiac concerns or symptoms Strength training completed today  Goals Unmet:  Not Applicable  Comments: Check out 445   Dr. Kate Sable is Medical Director for [place] and Pulmonary Rehab.

## 2016-09-30 ENCOUNTER — Encounter (HOSPITAL_COMMUNITY)
Admission: RE | Admit: 2016-09-30 | Discharge: 2016-09-30 | Disposition: A | Discharge status: Home / Self Care | Payer: 59 | Source: Ambulatory Visit | Attending: Cardiology | Admitting: Cardiology

## 2016-09-30 DIAGNOSIS — I214 Non-ST elevation (NSTEMI) myocardial infarction: Secondary | ICD-10-CM

## 2016-09-30 DIAGNOSIS — Z955 Presence of coronary angioplasty implant and graft: Secondary | ICD-10-CM

## 2016-10-02 ENCOUNTER — Encounter (HOSPITAL_COMMUNITY)
Admission: RE | Admit: 2016-10-02 | Discharge: 2016-10-02 | Disposition: A | Discharge status: Home / Self Care | Payer: 59 | Source: Ambulatory Visit | Attending: Cardiology | Admitting: Cardiology

## 2016-10-02 DIAGNOSIS — I214 Non-ST elevation (NSTEMI) myocardial infarction: Secondary | ICD-10-CM

## 2016-10-02 DIAGNOSIS — Z955 Presence of coronary angioplasty implant and graft: Secondary | ICD-10-CM

## 2016-10-02 NOTE — Progress Notes (Signed)
Daily Session Note  Patient Details  Name: Rhonda Allen MRN: [id number] Date of Birth: 1956-06-14 Referring Provider:   Flowsheet Row CARDIAC REHAB PHASE II ORIENTATION from 09/18/2016 in [place]  Referring Provider  Dr. Domenic Polite      Encounter Date: 10/02/2016  Check In:     Session Check In - 10/02/16 1545      Check-In   Location [facility]   Staff Present Aundra Dubin, RN, BSN;Micaiah Remillard, MS, EP, [facility], Exercise Physiologist   Supervising physician immediately available to respond to emergencies See telemetry face sheet for immediately available MD   Medication changes reported     No   Fall or balance concerns reported    No   Warm-up and Cool-down Performed as group-led instruction   Resistance Training Performed Yes   VAD Patient? No     Pain Assessment   Currently in Pain? No/denies   Pain Score 0-No pain   Multiple Pain Sites No      Capillary Blood Glucose: No results found for this or any previous visit (from the past 24 hour(s)).   Goals Met:  Independence with exercise equipment Exercise tolerated well No report of cardiac concerns or symptoms Strength training completed today  Goals Unmet:  Not Applicable  Comments: Check out: 4:45   Dr. Kate Sable is Medical Director for [place] and Pulmonary Rehab.

## 2016-10-05 ENCOUNTER — Encounter (HOSPITAL_COMMUNITY)
Admission: RE | Admit: 2016-10-05 | Discharge: 2016-10-05 | Disposition: A | Discharge status: Home / Self Care | Payer: 59 | Source: Ambulatory Visit | Attending: Cardiology | Admitting: Cardiology

## 2016-10-05 DIAGNOSIS — I214 Non-ST elevation (NSTEMI) myocardial infarction: Secondary | ICD-10-CM | POA: Diagnosis not present

## 2016-10-05 NOTE — Progress Notes (Signed)
Daily Session Note  Patient Details  Name: LIBNI FUSARO MRN: [id number] Date of Birth: [birth year] Referring Provider:   Flowsheet Row CARDIAC REHAB PHASE II ORIENTATION from 09/18/2016 in [place]  Referring Provider  Dr. Domenic Polite      Encounter Date: 10/05/2016  Check In:     Session Check In - 10/05/16 1545      Check-In   Location [facility]   Staff Present Aundra Dubin, RN, BSN;Braylen Staller Luther Parody, BS, EP, Exercise Physiologist   Supervising physician immediately available to respond to emergencies See telemetry face sheet for immediately available MD   Medication changes reported     No   Fall or balance concerns reported    No   Warm-up and Cool-down Performed as group-led instruction   Resistance Training Performed Yes   VAD Patient? No     Pain Assessment   Currently in Pain? No/denies   Pain Score 0-No pain   Multiple Pain Sites No      Capillary Blood Glucose: No results found for this or any previous visit (from the past 24 hour(s)).   Goals Met:  Independence with exercise equipment Exercise tolerated well No report of cardiac concerns or symptoms Strength training completed today  Goals Unmet:  Not Applicable  Comments: Check out 445   Dr. Kate Sable is Medical Director for [place] and Pulmonary Rehab.

## 2016-10-07 ENCOUNTER — Encounter (HOSPITAL_COMMUNITY)
Admission: RE | Admit: 2016-10-07 | Discharge: 2016-10-07 | Disposition: A | Discharge status: Home / Self Care | Payer: 59 | Source: Ambulatory Visit | Attending: Cardiology | Admitting: Cardiology

## 2016-10-07 DIAGNOSIS — I214 Non-ST elevation (NSTEMI) myocardial infarction: Secondary | ICD-10-CM | POA: Diagnosis not present

## 2016-10-07 NOTE — Progress Notes (Signed)
Daily Session Note  Patient Details  Name: Rhonda Allen MRN: [id number] Date of Birth: 10/14/1956 Referring Provider:   Flowsheet Row CARDIAC REHAB PHASE II ORIENTATION from 09/18/2016 in [place] CARDIAC REHABILITATION  Referring Provider  Dr. McDowell      Encounter Date: 10/07/2016  Check In:     Session Check In - 10/07/16 1545      Check-In   Location [facility]   Staff Present Diane Coad, MS, EP, [facility], Exercise Physiologist;Debra Johnson, RN, BSN   Supervising physician immediately available to respond to emergencies See telemetry face sheet for immediately available MD   Medication changes reported     No   Fall or balance concerns reported    No   Warm-up and Cool-down Performed as group-led instruction   Resistance Training Performed Yes   VAD Patient? No     Pain Assessment   Currently in Pain? No/denies   Pain Score 0-No pain   Multiple Pain Sites No      Capillary Blood Glucose: No results found for this or any previous visit (from the past 24 hour(s)).   Goals Met:  Independence with exercise equipment Exercise tolerated well No report of cardiac concerns or symptoms Strength training completed today  Goals Unmet:  Not Applicable  Comments: Check out 1645.   Dr. Suresh Koneswaran is Medical Director for [place] [facility] and Pulmonary Rehab. 

## 2016-10-09 ENCOUNTER — Encounter (HOSPITAL_COMMUNITY)
Admission: RE | Admit: 2016-10-09 | Discharge: 2016-10-09 | Disposition: A | Discharge status: Home / Self Care | Payer: 59 | Source: Ambulatory Visit | Attending: Cardiology | Admitting: Cardiology

## 2016-10-09 DIAGNOSIS — Z955 Presence of coronary angioplasty implant and graft: Secondary | ICD-10-CM

## 2016-10-09 DIAGNOSIS — I214 Non-ST elevation (NSTEMI) myocardial infarction: Secondary | ICD-10-CM

## 2016-10-09 NOTE — Progress Notes (Signed)
Daily Session Note  Patient Details  Name: CEARRA PORTNOY MRN: [id number] Date of Birth: [birth year] Referring Provider:   Flowsheet Row CARDIAC REHAB PHASE II ORIENTATION from 09/18/2016 in [place]  Referring Provider  Dr. Domenic Polite      Encounter Date: 10/09/2016  Check In:     Session Check In - 10/09/16 1545      Check-In   Location [facility]   Staff Present Kanen Mottola Angelina Pih, MS, EP, [facility], Exercise Physiologist;Debra Wynetta Emery, RN, BSN   Supervising physician immediately available to respond to emergencies See telemetry face sheet for immediately available MD   Medication changes reported     No   Fall or balance concerns reported    No   Warm-up and Cool-down Performed as group-led instruction   Resistance Training Performed Yes   VAD Patient? No     Pain Assessment   Currently in Pain? No/denies   Pain Score 0-No pain   Multiple Pain Sites No      Capillary Blood Glucose: No results found for this or any previous visit (from the past 24 hour(s)).   Goals Met:  Independence with exercise equipment Exercise tolerated well No report of cardiac concerns or symptoms Strength training completed today  Goals Unmet:  Not Applicable  Comments: Check out 445   Dr. Kate Sable is Medical Director for [place] and Pulmonary Rehab.

## 2016-10-12 ENCOUNTER — Encounter (HOSPITAL_COMMUNITY): Payer: 59

## 2016-10-14 ENCOUNTER — Encounter (HOSPITAL_COMMUNITY)
Admission: RE | Admit: 2016-10-14 | Discharge: 2016-10-14 | Disposition: A | Discharge status: Home / Self Care | Payer: 59 | Source: Ambulatory Visit | Attending: Cardiology | Admitting: Cardiology

## 2016-10-14 DIAGNOSIS — Z955 Presence of coronary angioplasty implant and graft: Secondary | ICD-10-CM

## 2016-10-14 DIAGNOSIS — I214 Non-ST elevation (NSTEMI) myocardial infarction: Secondary | ICD-10-CM

## 2016-10-14 NOTE — Progress Notes (Signed)
Daily Session Note  Patient Details  Name: Rhonda Allen MRN: [id number] Date of Birth: 27-Nov-1956 Referring Provider:   Flowsheet Row CARDIAC REHAB PHASE II ORIENTATION from 09/18/2016 in [place]  Referring Provider  Dr. Domenic Polite      Encounter Date: 10/14/2016  Check In:     Session Check In - 10/14/16 1636      Check-In   Location [facility]   Staff Present Russella Dar, MS, EP, [facility], Exercise Physiologist;Debra Wynetta Emery, RN, BSN   Supervising physician immediately available to respond to emergencies See telemetry face sheet for immediately available MD   Medication changes reported     No   Fall or balance concerns reported    No   Warm-up and Cool-down Performed as group-led instruction   Resistance Training Performed Yes   VAD Patient? No     Pain Assessment   Currently in Pain? No/denies   Pain Score 0-No pain   Multiple Pain Sites No      Capillary Blood Glucose: No results found for this or any previous visit (from the past 24 hour(s)).   Goals Met:  Independence with exercise equipment Exercise tolerated well No report of cardiac concerns or symptoms Strength training completed today  Goals Unmet:  Not Applicable  Comments: Check out: [place]   Dr. Kate Sable is Medical Director for [place] and Pulmonary Rehab.

## 2016-10-16 ENCOUNTER — Encounter (HOSPITAL_COMMUNITY)
Admission: RE | Admit: 2016-10-16 | Discharge: 2016-10-16 | Disposition: A | Discharge status: Home / Self Care | Payer: 59 | Source: Ambulatory Visit | Attending: Cardiology | Admitting: Cardiology

## 2016-10-16 DIAGNOSIS — Z955 Presence of coronary angioplasty implant and graft: Secondary | ICD-10-CM

## 2016-10-16 DIAGNOSIS — I214 Non-ST elevation (NSTEMI) myocardial infarction: Secondary | ICD-10-CM

## 2016-10-16 NOTE — Progress Notes (Signed)
Daily Session Note  Patient Details  Name: Rhonda Allen MRN: [id number] Date of Birth: July 14, 1956 Referring Provider:   Flowsheet Row CARDIAC REHAB PHASE II ORIENTATION from 09/18/2016 in [place]  Referring Provider  Dr. Domenic Polite      Encounter Date: 10/16/2016  Check In:     Session Check In - 10/16/16 1545      Check-In   Location [facility]   Staff Present [place] Angelina Pih, MS, EP, [facility], Exercise Physiologist;Debra Wynetta Emery, RN, BSN   Supervising physician immediately available to respond to emergencies See telemetry face sheet for immediately available MD   Medication changes reported     No   Fall or balance concerns reported    No   Warm-up and Cool-down Performed as group-led instruction   Resistance Training Performed Yes   VAD Patient? No     Pain Assessment   Currently in Pain? No/denies   Pain Score 0-No pain   Multiple Pain Sites No      Capillary Blood Glucose: No results found for this or any previous visit (from the past 24 hour(s)).   Goals Met:  Independence with exercise equipment Exercise tolerated well No report of cardiac concerns or symptoms Strength training completed today  Goals Unmet:  Not Applicable  Comments: Check out: 4:45   Dr. Kate Sable is Medical Director for [place] and Pulmonary Rehab.

## 2016-10-19 ENCOUNTER — Encounter (HOSPITAL_COMMUNITY)
Admission: RE | Admit: 2016-10-19 | Discharge: 2016-10-19 | Disposition: A | Discharge status: Home / Self Care | Payer: 59 | Source: Ambulatory Visit | Attending: Cardiology | Admitting: Cardiology

## 2016-10-19 DIAGNOSIS — I214 Non-ST elevation (NSTEMI) myocardial infarction: Secondary | ICD-10-CM | POA: Diagnosis not present

## 2016-10-19 NOTE — Progress Notes (Signed)
Daily Session Note  Patient Details  Name: Rhonda Allen MRN: [id number] Date of Birth: Dec 30, 1955 Referring Provider:   Flowsheet Row CARDIAC REHAB PHASE II ORIENTATION from 09/18/2016 in [place]  Referring Provider  Dr. Domenic Polite      Encounter Date: 10/19/2016  Check In:     Session Check In - 10/19/16 1545      Check-In   Location [facility]   Staff Present Suzanne Boron, BS, EP, Exercise Physiologist;Clydell Alberts Wynetta Emery, RN, BSN   Supervising physician immediately available to respond to emergencies See telemetry face sheet for immediately available MD   Medication changes reported     No   Fall or balance concerns reported    No   Warm-up and Cool-down Performed as group-led instruction   Resistance Training Performed Yes   VAD Patient? No     Pain Assessment   Currently in Pain? No/denies   Pain Score 0-No pain   Multiple Pain Sites No      Capillary Blood Glucose: No results found for this or any previous visit (from the past 24 hour(s)).   Goals Met:  Independence with exercise equipment Exercise tolerated well No report of cardiac concerns or symptoms Strength training completed today  Goals Unmet:  Not Applicable  Comments: Check out 1645.   Dr. Kate Sable is Medical Director for [facility] Cardiac and Pulmonary Rehab.

## 2016-10-21 ENCOUNTER — Encounter (HOSPITAL_COMMUNITY)
Admission: RE | Admit: 2016-10-21 | Discharge: 2016-10-21 | Disposition: A | Discharge status: Home / Self Care | Payer: 59 | Source: Ambulatory Visit | Attending: Cardiology | Admitting: Cardiology

## 2016-10-21 DIAGNOSIS — Z955 Presence of coronary angioplasty implant and graft: Secondary | ICD-10-CM

## 2016-10-21 DIAGNOSIS — I214 Non-ST elevation (NSTEMI) myocardial infarction: Secondary | ICD-10-CM | POA: Diagnosis not present

## 2016-10-21 NOTE — Progress Notes (Signed)
Daily Session Note  Patient Details  Name: Rhonda Allen MRN: [id number] Date of Birth: December 04, 1956 Referring Provider:   Flowsheet Row CARDIAC REHAB PHASE II ORIENTATION from 09/18/2016 in [place]  Referring Provider  Dr. Domenic Polite      Encounter Date: 10/21/2016  Check In:     Session Check In - 10/21/16 1633      Check-In   Location [facility]   Staff Present Dontaye Hur Angelina Pih, MS, EP, [facility], Exercise Physiologist;Debra Wynetta Emery, RN, BSN   Supervising physician immediately available to respond to emergencies See telemetry face sheet for immediately available MD   Medication changes reported     No   Fall or balance concerns reported    No   Warm-up and Cool-down Performed as group-led instruction   Resistance Training Performed Yes   VAD Patient? No     Pain Assessment   Currently in Pain? No/denies   Pain Score 0-No pain   Multiple Pain Sites No      Capillary Blood Glucose: No results found for this or any previous visit (from the past 24 hour(s)).   Goals Met:  Independence with exercise equipment Exercise tolerated well No report of cardiac concerns or symptoms Strength training completed today  Goals Unmet:  Not Applicable  Comments: Check out: 4:45   Dr. Kate Sable is Medical Director for [place] and Pulmonary Rehab.

## 2016-10-23 ENCOUNTER — Encounter (HOSPITAL_COMMUNITY): Payer: 59

## 2016-10-26 ENCOUNTER — Encounter (HOSPITAL_COMMUNITY)
Admission: RE | Admit: 2016-10-26 | Discharge: 2016-10-26 | Disposition: A | Discharge status: Home / Self Care | Payer: 59 | Source: Ambulatory Visit | Attending: Cardiology | Admitting: Cardiology

## 2016-10-26 DIAGNOSIS — I214 Non-ST elevation (NSTEMI) myocardial infarction: Secondary | ICD-10-CM | POA: Diagnosis not present

## 2016-10-28 ENCOUNTER — Encounter (HOSPITAL_COMMUNITY)
Admission: RE | Admit: 2016-10-28 | Discharge: 2016-10-28 | Disposition: A | Discharge status: Home / Self Care | Payer: 59 | Source: Ambulatory Visit | Attending: Cardiology | Admitting: Cardiology

## 2016-10-28 DIAGNOSIS — Z955 Presence of coronary angioplasty implant and graft: Secondary | ICD-10-CM | POA: Diagnosis not present

## 2016-10-28 DIAGNOSIS — I214 Non-ST elevation (NSTEMI) myocardial infarction: Secondary | ICD-10-CM | POA: Insufficient documentation

## 2016-10-28 NOTE — Progress Notes (Signed)
Daily Session Note  Patient Details  Name: Rhonda Allen MRN: [id number] Date of Birth: 1956/03/14 Referring Provider:   Flowsheet Row CARDIAC REHAB PHASE II ORIENTATION from 09/18/2016 in [place]  Referring Provider  Dr. Domenic Polite      Encounter Date: 10/28/2016  Check In:     Session Check In - 10/28/16 1545      Check-In   Location [facility]   Staff Present Diane Angelina Pih, MS, EP, [facility], Exercise Physiologist;Martavious Hartel Wynetta Emery, RN, BSN   Supervising physician immediately available to respond to emergencies See telemetry face sheet for immediately available MD   Medication changes reported     No   Fall or balance concerns reported    No   Warm-up and Cool-down Performed as group-led instruction   Resistance Training Performed Yes   VAD Patient? No     Pain Assessment   Currently in Pain? No/denies   Pain Score 0-No pain   Multiple Pain Sites No      Capillary Blood Glucose: No results found for this or any previous visit (from the past 24 hour(s)).   Goals Met:  Independence with exercise equipment Exercise tolerated well No report of cardiac concerns or symptoms Strength training completed today  Goals Unmet:  Not Applicable  Comments: Check out 1645   Dr. Kate Sable is Medical Director for [place] and Pulmonary Rehab.

## 2016-10-30 ENCOUNTER — Encounter (HOSPITAL_COMMUNITY): Payer: 59

## 2016-11-02 ENCOUNTER — Encounter (HOSPITAL_COMMUNITY)
Admission: RE | Admit: 2016-11-02 | Discharge: 2016-11-02 | Disposition: A | Discharge status: Home / Self Care | Payer: 59 | Source: Ambulatory Visit | Attending: Cardiology | Admitting: Cardiology

## 2016-11-02 DIAGNOSIS — I214 Non-ST elevation (NSTEMI) myocardial infarction: Secondary | ICD-10-CM | POA: Diagnosis not present

## 2016-11-02 NOTE — Progress Notes (Signed)
Daily Session Note  Patient Details  Name: Rhonda Allen MRN: [id number] Date of Birth: 05/08/1956 Referring Provider:   Flowsheet Row CARDIAC REHAB PHASE II ORIENTATION from 09/18/2016 in [place]  Referring Provider  Dr. Domenic Polite      Encounter Date: 11/02/2016  Check In:     Session Check In - 11/02/16 1545      Check-In   Location [facility]   Staff Present Diane Angelina Pih, MS, EP, [facility], Exercise Physiologist;Alistar Mcenery Wynetta Emery, RN, BSN   Supervising physician immediately available to respond to emergencies See telemetry face sheet for immediately available MD   Medication changes reported     No   Fall or balance concerns reported    No   Warm-up and Cool-down Performed as group-led instruction   Resistance Training Performed Yes   VAD Patient? No     Pain Assessment   Currently in Pain? No/denies   Pain Score 0-No pain   Multiple Pain Sites No      Capillary Blood Glucose: No results found for this or any previous visit (from the past 24 hour(s)).   Goals Met:  Independence with exercise equipment Exercise tolerated well No report of cardiac concerns or symptoms Strength training completed today  Goals Unmet:  Not Applicable  Comments: Check out 1645.   Dr. Kate Sable is Medical Director for [facility] Cardiac and Pulmonary Rehab.

## 2016-11-04 ENCOUNTER — Encounter (HOSPITAL_COMMUNITY)
Admission: RE | Admit: 2016-11-04 | Discharge: 2016-11-04 | Disposition: A | Discharge status: Home / Self Care | Payer: 59 | Source: Ambulatory Visit | Attending: Cardiology | Admitting: Cardiology

## 2016-11-04 DIAGNOSIS — I214 Non-ST elevation (NSTEMI) myocardial infarction: Secondary | ICD-10-CM | POA: Diagnosis not present

## 2016-11-04 NOTE — Progress Notes (Signed)
Daily Session Note  Patient Details  Name: Rhonda Allen MRN: [id number] Date of Birth: 02-13-1956 Referring Provider:   Flowsheet Row CARDIAC REHAB PHASE II ORIENTATION from 09/18/2016 in [place]  Referring Provider  Dr. Domenic Polite      Encounter Date: 11/04/2016  Check In:     Session Check In - 11/04/16 1545      Check-In   Location [facility]   Staff Present Diane Angelina Pih, MS, EP, [facility], Exercise Physiologist;Shantina Chronister Wynetta Emery, RN, BSN   Supervising physician immediately available to respond to emergencies See telemetry face sheet for immediately available MD   Medication changes reported     No   Fall or balance concerns reported    No   Warm-up and Cool-down Performed as group-led instruction   Resistance Training Performed Yes   VAD Patient? No     Pain Assessment   Currently in Pain? No/denies   Pain Score 0-No pain   Multiple Pain Sites No      Capillary Blood Glucose: No results found for this or any previous visit (from the past 24 hour(s)).   Goals Met:  Independence with exercise equipment Exercise tolerated well No report of cardiac concerns or symptoms Strength training completed today  Goals Unmet:  Not Applicable  Comments: Check out 1645.   Dr. Kate Sable is Medical Director for [facility] Cardiac and Pulmonary Rehab.

## 2016-11-06 ENCOUNTER — Encounter (HOSPITAL_COMMUNITY)
Admission: RE | Admit: 2016-11-06 | Discharge: 2016-11-06 | Disposition: A | Discharge status: Home / Self Care | Payer: 59 | Source: Ambulatory Visit | Attending: Cardiology | Admitting: Cardiology

## 2016-11-06 DIAGNOSIS — I214 Non-ST elevation (NSTEMI) myocardial infarction: Secondary | ICD-10-CM

## 2016-11-06 DIAGNOSIS — Z955 Presence of coronary angioplasty implant and graft: Secondary | ICD-10-CM

## 2016-11-06 NOTE — Progress Notes (Signed)
Daily Session Note  Patient Details  Name: Rhonda Allen MRN: [id number] Date of Birth: Oct 07, 1956 Referring Provider:   Flowsheet Row CARDIAC REHAB PHASE II ORIENTATION from 09/18/2016 in [place]  Referring Provider  Dr. Domenic Polite      Encounter Date: 11/06/2016  Check In:     Session Check In - 11/06/16 1545      Check-In   Location [facility]   Staff Present Silvanna Ohmer Angelina Pih, MS, EP, [facility], Exercise Physiologist;Debra Wynetta Emery, RN, BSN   Supervising physician immediately available to respond to emergencies See telemetry face sheet for immediately available MD   Medication changes reported     No   Fall or balance concerns reported    No   Warm-up and Cool-down Performed as group-led instruction   Resistance Training Performed Yes   VAD Patient? No     Pain Assessment   Currently in Pain? No/denies   Pain Score 0-No pain   Multiple Pain Sites No      Capillary Blood Glucose: No results found for this or any previous visit (from the past 24 hour(s)).   Goals Met:  Independence with exercise equipment Exercise tolerated well No report of cardiac concerns or symptoms Strength training completed today  Goals Unmet:  Not Applicable  Comments: Check out: 4:45   Dr. Kate Sable is Medical Director for [place] and Pulmonary Rehab.

## 2016-11-09 ENCOUNTER — Encounter (HOSPITAL_COMMUNITY)
Admission: RE | Admit: 2016-11-09 | Discharge: 2016-11-09 | Disposition: A | Discharge status: Home / Self Care | Payer: 59 | Source: Ambulatory Visit | Attending: Cardiology | Admitting: Cardiology

## 2016-11-09 DIAGNOSIS — I214 Non-ST elevation (NSTEMI) myocardial infarction: Secondary | ICD-10-CM

## 2016-11-09 NOTE — Progress Notes (Signed)
Daily Session Note  Patient Details  Name: Rhonda Allen MRN: [id number] Date of Birth: 07-09-1956 Referring Provider:   Flowsheet Row CARDIAC REHAB PHASE II ORIENTATION from 09/18/2016 in [place]  Referring Provider  Dr. Domenic Polite      Encounter Date: 11/09/2016  Check In:     Session Check In - 11/09/16 1545      Check-In   Location [facility]   Staff Present Diane Angelina Pih, MS, EP, [facility], Exercise Physiologist;Esther Broyles Wynetta Emery, RN, BSN   Supervising physician immediately available to respond to emergencies See telemetry face sheet for immediately available MD   Medication changes reported     No   Fall or balance concerns reported    No   Warm-up and Cool-down Performed as group-led instruction   Resistance Training Performed Yes   VAD Patient? No     Pain Assessment   Currently in Pain? No/denies   Pain Score 0-No pain   Multiple Pain Sites No      Capillary Blood Glucose: No results found for this or any previous visit (from the past 24 hour(s)).   Goals Met:  Independence with exercise equipment Exercise tolerated well No report of cardiac concerns or symptoms Strength training completed today  Goals Unmet:  Not Applicable  Comments: Check out 1645.   Dr. Kate Sable is Medical Director for [facility] Cardiac and Pulmonary Rehab.

## 2016-11-11 ENCOUNTER — Encounter (HOSPITAL_COMMUNITY)
Admission: RE | Admit: 2016-11-11 | Discharge: 2016-11-11 | Disposition: A | Discharge status: Home / Self Care | Payer: 59 | Source: Ambulatory Visit | Attending: Cardiology | Admitting: Cardiology

## 2016-11-11 DIAGNOSIS — I214 Non-ST elevation (NSTEMI) myocardial infarction: Secondary | ICD-10-CM | POA: Diagnosis not present

## 2016-11-11 NOTE — Progress Notes (Signed)
Daily Session Note  Patient Details  Name: Rhonda Allen MRN: [id number] Date of Birth: 05-11-1956 Referring Provider:   Flowsheet Row CARDIAC REHAB PHASE II ORIENTATION from 09/18/2016 in [place]  Referring Provider  Dr. Domenic Polite      Encounter Date: 11/11/2016  Check In:     Session Check In - 11/11/16 1545      Check-In   Location [facility]   Staff Present Aundra Dubin, RN, BSN;Milissa Fesperman Luther Parody, BS, EP, Exercise Physiologist   Supervising physician immediately available to respond to emergencies See telemetry face sheet for immediately available MD   Medication changes reported     No   Fall or balance concerns reported    No   Warm-up and Cool-down Performed as group-led instruction   Resistance Training Performed Yes   VAD Patient? No     Pain Assessment   Currently in Pain? No/denies   Pain Score 0-No pain   Multiple Pain Sites No      Capillary Blood Glucose: No results found for this or any previous visit (from the past 24 hour(s)).   Goals Met:  Independence with exercise equipment Exercise tolerated well No report of cardiac concerns or symptoms Strength training completed today  Goals Unmet:  Not Applicable  Comments: Check out 445   Dr. Kate Sable is Medical Director for [place] and Pulmonary Rehab.

## 2016-11-13 ENCOUNTER — Encounter (HOSPITAL_COMMUNITY): Payer: 59

## 2016-11-16 ENCOUNTER — Encounter (HOSPITAL_COMMUNITY)
Admission: RE | Admit: 2016-11-16 | Discharge: 2016-11-16 | Disposition: A | Discharge status: Home / Self Care | Payer: 59 | Source: Ambulatory Visit | Attending: Cardiology | Admitting: Cardiology

## 2016-11-16 DIAGNOSIS — I214 Non-ST elevation (NSTEMI) myocardial infarction: Secondary | ICD-10-CM

## 2016-11-16 NOTE — Progress Notes (Signed)
Daily Session Note  Patient Details  Name: Rhonda Allen MRN: [id number] Date of Birth: 1956-09-03 Referring Provider:   Flowsheet Row CARDIAC REHAB PHASE II ORIENTATION from 09/18/2016 in [place]  Referring Provider  Dr. Domenic Polite      Encounter Date: 11/16/2016  Check In:     Session Check In - 11/16/16 1545      Check-In   Location [facility]   Staff Present Diane Angelina Pih, MS, EP, [facility], Exercise Physiologist;Nayely Dingus Wynetta Emery, RN, BSN   Supervising physician immediately available to respond to emergencies See telemetry face sheet for immediately available MD   Medication changes reported     No   Fall or balance concerns reported    No   Warm-up and Cool-down Performed as group-led instruction   Resistance Training Performed Yes   VAD Patient? No     Pain Assessment   Currently in Pain? No/denies   Pain Score 0-No pain   Multiple Pain Sites No      Capillary Blood Glucose: No results found for this or any previous visit (from the past 24 hour(s)).   Goals Met:  Independence with exercise equipment Exercise tolerated well No report of cardiac concerns or symptoms Strength training completed today  Goals Unmet:  Not Applicable  Comments: Check out 1645.   Dr. Kate Sable is Medical Director for [facility] Cardiac and Pulmonary Rehab.

## 2016-11-18 ENCOUNTER — Encounter (HOSPITAL_COMMUNITY)
Admission: RE | Admit: 2016-11-18 | Discharge: 2016-11-18 | Disposition: A | Discharge status: Home / Self Care | Payer: 59 | Source: Ambulatory Visit | Attending: Cardiology | Admitting: Cardiology

## 2016-11-18 DIAGNOSIS — I214 Non-ST elevation (NSTEMI) myocardial infarction: Secondary | ICD-10-CM

## 2016-11-18 NOTE — Progress Notes (Signed)
Daily Session Note  Patient Details  Name: Rhonda Allen MRN: [id number] Date of Birth: Jul 16, 1956 Referring Provider:   Flowsheet Row CARDIAC REHAB PHASE II ORIENTATION from 09/18/2016 in [place]  Referring Provider  Dr. Domenic Polite      Encounter Date: 11/18/2016  Check In:     Session Check In - 11/18/16 1521      Check-In   Location [facility]   Staff Present Aundra Dubin, RN, BSN;Romain Erion Luther Parody, BS, EP, Exercise Physiologist   Supervising physician immediately available to respond to emergencies See telemetry face sheet for immediately available MD   Medication changes reported     No   Fall or balance concerns reported    No   Warm-up and Cool-down Performed as group-led instruction   Resistance Training Performed Yes   VAD Patient? No     Pain Assessment   Currently in Pain? No/denies   Pain Score 0-No pain   Multiple Pain Sites No      Capillary Blood Glucose: No results found for this or any previous visit (from the past 24 hour(s)).   Goals Met:  Independence with exercise equipment Exercise tolerated well No report of cardiac concerns or symptoms Strength training completed today  Goals Unmet:  Not Applicable  Comments: Check out 445   Dr. Kate Sable is Medical Director for [place] and Pulmonary Rehab.

## 2016-11-20 ENCOUNTER — Encounter (HOSPITAL_COMMUNITY): Payer: 59

## 2016-11-23 ENCOUNTER — Encounter (HOSPITAL_COMMUNITY)
Admission: RE | Admit: 2016-11-23 | Discharge: 2016-11-23 | Disposition: A | Discharge status: Home / Self Care | Payer: 59 | Source: Ambulatory Visit | Attending: Cardiology | Admitting: Cardiology

## 2016-11-23 DIAGNOSIS — I214 Non-ST elevation (NSTEMI) myocardial infarction: Secondary | ICD-10-CM

## 2016-11-23 DIAGNOSIS — Z955 Presence of coronary angioplasty implant and graft: Secondary | ICD-10-CM

## 2016-11-23 NOTE — Progress Notes (Signed)
Daily Session Note  Patient Details  Name: Rhonda Allen MRN: [id number] Date of Birth: 1956/08/15 Referring Provider:   Flowsheet Row CARDIAC REHAB PHASE II ORIENTATION from 09/18/2016 in [place]  Referring Provider  Dr. Domenic Polite      Encounter Date: 11/23/2016  Check In:     Session Check In - 11/23/16 1545      Check-In   Location [facility]   Staff Present Nichalos Brenton Angelina Pih, MS, EP, [facility], Exercise Physiologist;Debra Wynetta Emery, RN, BSN   Supervising physician immediately available to respond to emergencies See telemetry face sheet for immediately available MD   Medication changes reported     No   Fall or balance concerns reported    No   Warm-up and Cool-down Performed as group-led instruction   Resistance Training Performed Yes   VAD Patient? No     Pain Assessment   Currently in Pain? No/denies   Pain Score 0-No pain   Multiple Pain Sites No      Capillary Blood Glucose: No results found for this or any previous visit (from the past 24 hour(s)).   Goals Met:  Independence with exercise equipment Exercise tolerated well No report of cardiac concerns or symptoms Strength training completed today  Goals Unmet:  Not Applicable  Comments: Check out: 5:45   Dr. Kate Sable is Medical Director for [place] and Pulmonary Rehab.

## 2016-11-25 ENCOUNTER — Encounter (HOSPITAL_COMMUNITY)
Admission: RE | Admit: 2016-11-25 | Discharge: 2016-11-25 | Disposition: A | Discharge status: Home / Self Care | Payer: 59 | Source: Ambulatory Visit | Attending: Cardiology | Admitting: Cardiology

## 2016-11-25 DIAGNOSIS — I214 Non-ST elevation (NSTEMI) myocardial infarction: Secondary | ICD-10-CM

## 2016-11-25 NOTE — Progress Notes (Signed)
Daily Session Note  Patient Details  Name: Rhonda Allen MRN: [id number] Date of Birth: [birth year] Referring Provider:   Flowsheet Row CARDIAC REHAB PHASE II ORIENTATION from 09/18/2016 in [place]  Referring Provider  Dr. Domenic Polite      Encounter Date: 11/25/2016  Check In:     Session Check In - 11/25/16 1545      Check-In   Location [facility]   Staff Present Diane Angelina Pih, MS, EP, [facility], Exercise Physiologist;Genelle Economou Wynetta Emery, RN, BSN   Supervising physician immediately available to respond to emergencies See telemetry face sheet for immediately available MD   Medication changes reported     No   Fall or balance concerns reported    No   Warm-up and Cool-down Performed as group-led instruction   Resistance Training Performed Yes   VAD Patient? No     Pain Assessment   Currently in Pain? No/denies   Pain Score 0-No pain   Multiple Pain Sites No      Capillary Blood Glucose: No results found for this or any previous visit (from the past 24 hour(s)).   Goals Met:  Independence with exercise equipment Exercise tolerated well No report of cardiac concerns or symptoms Strength training completed today  Goals Unmet:  Not Applicable  Comments: Check out 1645.   Dr. Kate Sable is Medical Director for [facility] Cardiac and Pulmonary Rehab.

## 2016-11-27 ENCOUNTER — Encounter (HOSPITAL_COMMUNITY)
Admission: RE | Admit: 2016-11-27 | Discharge: 2016-11-27 | Disposition: A | Discharge status: Home / Self Care | Payer: 59 | Source: Ambulatory Visit | Attending: Cardiology | Admitting: Cardiology

## 2016-11-27 DIAGNOSIS — Z955 Presence of coronary angioplasty implant and graft: Secondary | ICD-10-CM | POA: Diagnosis not present

## 2016-11-27 DIAGNOSIS — I214 Non-ST elevation (NSTEMI) myocardial infarction: Secondary | ICD-10-CM | POA: Insufficient documentation

## 2016-11-27 NOTE — Progress Notes (Signed)
Daily Session Note  Patient Details  Name: Rhonda Allen MRN: [id number] Date of Birth: June 18, 1956 Referring Provider:   Flowsheet Row CARDIAC REHAB PHASE II ORIENTATION from 09/18/2016 in [place]  Referring Provider  Dr. Domenic Polite      Encounter Date: 11/27/2016  Check In:     Session Check In - 11/27/16 1545      Check-In   Location [facility]   Staff Present Suzanne Boron, BS, EP, Exercise Physiologist;Jendayi Berling Wynetta Emery, RN, BSN   Supervising physician immediately available to respond to emergencies See telemetry face sheet for immediately available MD   Medication changes reported     No   Fall or balance concerns reported    No   Warm-up and Cool-down Performed as group-led instruction   Resistance Training Performed Yes   VAD Patient? No     Pain Assessment   Currently in Pain? No/denies   Pain Score 0-No pain   Multiple Pain Sites No      Capillary Blood Glucose: No results found for this or any previous visit (from the past 24 hour(s)).   Goals Met:  Improved SOB with ADL's Exercise tolerated well No report of cardiac concerns or symptoms Strength training completed today  Goals Unmet:  Not Applicable  Comments: Check out 1645.   Dr. Kate Sable is Medical Director for [facility] Cardiac and Pulmonary Rehab.

## 2016-11-30 ENCOUNTER — Encounter (HOSPITAL_COMMUNITY)
Admission: RE | Admit: 2016-11-30 | Discharge: 2016-11-30 | Disposition: A | Discharge status: Home / Self Care | Payer: 59 | Source: Ambulatory Visit | Attending: Cardiology | Admitting: Cardiology

## 2016-11-30 DIAGNOSIS — Z955 Presence of coronary angioplasty implant and graft: Secondary | ICD-10-CM

## 2016-11-30 DIAGNOSIS — I214 Non-ST elevation (NSTEMI) myocardial infarction: Secondary | ICD-10-CM

## 2016-11-30 NOTE — Progress Notes (Signed)
Daily Session Note  Patient Details  Name: Rhonda Allen MRN: [id number] Date of Birth: 12/03/1956 Referring Provider:   Flowsheet Row CARDIAC REHAB PHASE II ORIENTATION from 09/18/2016 in [place] CARDIAC REHABILITATION  Referring Provider  Dr. McDowell      Encounter Date: 11/30/2016  Check In:     Session Check In - 11/30/16 1545      Check-In   Location [facility]   Staff Present Diane Coad, MS, EP, [facility], Exercise Physiologist;Debra Johnson, RN, BSN   Supervising physician immediately available to respond to emergencies See telemetry face sheet for immediately available MD   Medication changes reported     No   Fall or balance concerns reported    No   Warm-up and Cool-down Performed as group-led instruction   VAD Patient? No     Pain Assessment   Currently in Pain? No/denies   Pain Score 0-No pain   Multiple Pain Sites No      Capillary Blood Glucose: No results found for this or any previous visit (from the past 24 hour(s)).   Goals Met:  Independence with exercise equipment Exercise tolerated well No report of cardiac concerns or symptoms Strength training completed today  Goals Unmet:  Not Applicable  Comments: Check out: 4:45    Dr. Suresh Koneswaran is Medical Director for [place] Cardiac and Pulmonary Rehab. 

## 2016-12-02 ENCOUNTER — Encounter (HOSPITAL_COMMUNITY)
Admission: RE | Admit: 2016-12-02 | Discharge: 2016-12-02 | Disposition: A | Discharge status: Home / Self Care | Payer: 59 | Source: Ambulatory Visit | Attending: Cardiology | Admitting: Cardiology

## 2016-12-02 DIAGNOSIS — I214 Non-ST elevation (NSTEMI) myocardial infarction: Secondary | ICD-10-CM

## 2016-12-02 NOTE — Progress Notes (Signed)
Daily Session Note  Patient Details  Name: Rhonda Allen MRN: [id number] Date of Birth: 08-12-1956 Referring Provider:   Flowsheet Row CARDIAC REHAB PHASE II ORIENTATION from 09/18/2016 in [place]  Referring Provider  Dr. Domenic Polite      Encounter Date: 12/02/2016  Check In:     Session Check In - 12/02/16 1545      Check-In   Location [facility]   Staff Present Diane Angelina Pih, MS, EP, [facility], Exercise Physiologist;Lonn Im Wynetta Emery, RN, BSN   Supervising physician immediately available to respond to emergencies See telemetry face sheet for immediately available MD   Medication changes reported     No   Fall or balance concerns reported    No   Warm-up and Cool-down Performed as group-led instruction   Resistance Training Performed Yes   VAD Patient? No     Pain Assessment   Currently in Pain? No/denies   Pain Score 0-No pain   Multiple Pain Sites No      Capillary Blood Glucose: No results found for this or any previous visit (from the past 24 hour(s)).   Goals Met:  Independence with exercise equipment Exercise tolerated well No report of cardiac concerns or symptoms Strength training completed today  Goals Unmet:  Not Applicable  Comments: Check out 1645.   Dr. Kate Sable is Medical Director for [facility] Cardiac and Pulmonary Rehab.

## 2016-12-04 ENCOUNTER — Encounter (HOSPITAL_COMMUNITY)
Admission: RE | Admit: 2016-12-04 | Discharge: 2016-12-04 | Disposition: A | Discharge status: Home / Self Care | Payer: 59 | Source: Ambulatory Visit | Attending: Cardiology | Admitting: Cardiology

## 2016-12-04 DIAGNOSIS — I214 Non-ST elevation (NSTEMI) myocardial infarction: Secondary | ICD-10-CM

## 2016-12-04 NOTE — Progress Notes (Signed)
Daily Session Note  Patient Details  Name: LYNNLEE REVELS MRN: [id number] Date of Birth: 05-18-1956 Referring Tzivia Oneil:   Flowsheet Row CARDIAC REHAB PHASE II ORIENTATION from 09/18/2016 in [place]  Referring Tinea Nobile  Dr. Domenic Polite      Encounter Date: 12/04/2016  Check In:     Session Check In - 12/04/16 1533      Check-In   Location [facility]   Staff Present Russella Dar, MS, EP, [facility], Exercise Physiologist;Bartt Gonzaga Luther Parody, BS, EP, Exercise Physiologist   Supervising physician immediately available to respond to emergencies See telemetry face sheet for immediately available MD   Medication changes reported     No   Fall or balance concerns reported    No   Warm-up and Cool-down Performed as group-led instruction   Resistance Training Performed Yes   VAD Patient? No     Pain Assessment   Currently in Pain? No/denies   Pain Score 0-No pain   Multiple Pain Sites No      Capillary Blood Glucose: No results found for this or any previous visit (from the past 24 hour(s)).   Goals Met:  Independence with exercise equipment Exercise tolerated well No report of cardiac concerns or symptoms Strength training completed today  Goals Unmet:  Not Applicable  Comments: Check out 445   Dr. Kate Sable is Medical Director for [place] and Pulmonary Rehab.

## 2016-12-07 ENCOUNTER — Encounter (HOSPITAL_COMMUNITY)
Admission: RE | Admit: 2016-12-07 | Discharge: 2016-12-07 | Disposition: A | Discharge status: Home / Self Care | Payer: 59 | Source: Ambulatory Visit | Attending: Cardiology | Admitting: Cardiology

## 2016-12-07 DIAGNOSIS — I214 Non-ST elevation (NSTEMI) myocardial infarction: Secondary | ICD-10-CM | POA: Diagnosis not present

## 2016-12-07 NOTE — Progress Notes (Signed)
Daily Session Note  Patient Details  Name: Rhonda Allen MRN: [id number] Date of Birth: Dec 10, 1956 Referring Provider:   Flowsheet Row CARDIAC REHAB PHASE II ORIENTATION from 09/18/2016 in [place]  Referring Provider  Dr. Domenic Polite      Encounter Date: 12/07/2016  Check In:     Session Check In - 12/07/16 1545      Check-In   Location [facility]   Staff Present Diane Angelina Pih, MS, EP, [facility], Exercise Physiologist;Melvin Whiteford Wynetta Emery, RN, BSN   Supervising physician immediately available to respond to emergencies See telemetry face sheet for immediately available MD   Medication changes reported     No   Fall or balance concerns reported    No   Warm-up and Cool-down Performed as group-led instruction   Resistance Training Performed Yes   VAD Patient? No     Pain Assessment   Currently in Pain? No/denies   Pain Score 0-No pain   Multiple Pain Sites No      Capillary Blood Glucose: No results found for this or any previous visit (from the past 24 hour(s)).   Goals Met:  Independence with exercise equipment Exercise tolerated well No report of cardiac concerns or symptoms Strength training completed today  Goals Unmet:  Not Applicable  Comments: Check out 1645.   Dr. Kate Sable is Medical Director for [facility] Cardiac and Pulmonary Rehab.

## 2016-12-09 ENCOUNTER — Encounter (HOSPITAL_COMMUNITY)
Admission: RE | Admit: 2016-12-09 | Discharge: 2016-12-09 | Disposition: A | Discharge status: Home / Self Care | Payer: 59 | Source: Ambulatory Visit | Attending: Cardiology | Admitting: Cardiology

## 2016-12-09 DIAGNOSIS — I214 Non-ST elevation (NSTEMI) myocardial infarction: Secondary | ICD-10-CM

## 2016-12-09 NOTE — Progress Notes (Signed)
Cardiac Individual Treatment Plan  Patient Details  Name: Rhonda Allen MRN: [id number] Date of Birth: 1956-05-18 Referring Provider:   Flowsheet Row CARDIAC REHAB PHASE II ORIENTATION from 09/18/2016 in [place]  Referring Provider  Dr. Domenic Polite      Initial Encounter Date:  Flowsheet Row CARDIAC REHAB PHASE II ORIENTATION from 09/18/2016 in [place]  Date  09/18/16  Referring Provider  Dr. Domenic Polite      Visit Diagnosis: NSTEMI (non-ST elevated myocardial infarction) [facility])  Patient's Home Medications on Admission:  Current Outpatient Prescriptions:  .  ACCU-CHEK AVIVA PLUS test strip, , Disp: , Rfl:  .  aspirin 81 MG tablet, Take 81 mg by mouth daily.  , Disp: , Rfl:  .  atorvastatin (LIPITOR) 80 MG tablet, Take 1 tablet (80 mg total) by mouth daily at 6 PM., Disp: 30 tablet, Rfl: 5 .  B-D ULTRAFINE III SHORT PEN 31G X 8 MM MISC, , Disp: , Rfl:  .  BD INSULIN SYRINGE ULTRAFINE 31G X 5/16" 0.3 ML MISC, , Disp: , Rfl:  .  Ferrous Sulfate (IRON) 325 (65 FE) MG TABS, Take 1 tablet by mouth every Monday, Wednesday, and Friday. , Disp: , Rfl:  .  insulin glargine (LANTUS) 100 UNIT/ML injection, Inject 18-19 Units into the skin at bedtime. , Disp: , Rfl:  .  insulin lispro (HUMALOG) 100 UNIT/ML injection, Inject 8-15 Units into the skin 3 (three) times daily before meals. Per sliding scale, Disp: , Rfl:  .  lisinopril (PRINIVIL,ZESTRIL) 10 MG tablet, Take 10 mg by mouth daily.  , Disp: , Rfl:  .  metoprolol tartrate (LOPRESSOR) 25 MG tablet, Take 0.5 tablets (12.5 mg total) by mouth 2 (two) times daily., Disp: 60 tablet, Rfl: 5 .  Multiple Vitamin (MULTIVITAMIN) tablet, Take 1 tablet by mouth every Monday, Wednesday, and Friday. , Disp: , Rfl:  .  nitroGLYCERIN (NITROSTAT) 0.4 MG SL tablet, Place 1 tablet (0.4 mg total) under the tongue every 5 (five) minutes as needed for chest pain., Disp: 25 tablet, Rfl: 2 .  Omega-3 Fatty Acids  (FISH OIL PO), Take 1 capsule by mouth See admin instructions. Once to twice daily, Disp: , Rfl:  .  omeprazole (PRILOSEC) 40 MG capsule, TAKE ONE CAPSULE BY MOUTH DAILY, Disp: 30 capsule, Rfl: 2 .  polycarbophil (FIBERCON) 625 MG tablet, Take 625 mg by mouth daily., Disp: , Rfl:  .  ticagrelor (BRILINTA) 90 MG TABS tablet, Take 1 tablet (90 mg total) by mouth 2 (two) times daily., Disp: 60 tablet, Rfl: 10  Past Medical History: Past Medical History:  Diagnosis Date  . Anemia   . Diabetes mellitus   . Diverticulosis of colon   . Frozen shoulder    right  . GERD (gastroesophageal reflux disease)   . Hemorrhoids   . Hypertension     Tobacco Use: History  Smoking Status  . Never Smoker  Smokeless Tobacco  . Never Used    Labs: Recent Review Flowsheet Data    Labs for ITP Cardiac and Pulmonary Rehab Latest Ref Rng & Units 08/15/2016   Cholestrol 0 - 200 mg/dL 216(H)   LDLCALC 0 - 99 mg/dL 128(H)   HDL >40 mg/dL 80   Trlycerides <150 mg/dL 40      Capillary Blood Glucose: Lab Results  Component Value Date   GLUCAP 285 (H) 08/18/2016   GLUCAP 131 (H) 08/18/2016   GLUCAP 180 (H) 08/17/2016   GLUCAP 122 (H) 08/17/2016  GLUCAP 138 (H) 08/17/2016     Exercise Target Goals:    Exercise Program Goal: Individual exercise prescription set with THRR, safety & activity barriers. Participant demonstrates ability to understand and report RPE using BORG scale, to self-measure pulse accurately, and to acknowledge the importance of the exercise prescription.  Exercise Prescription Goal: Starting with aerobic activity 30 plus minutes a day, 3 days per week for initial exercise prescription. Provide home exercise prescription and guidelines that participant acknowledges understanding prior to discharge.  Activity Barriers & Risk Stratification:     Activity Barriers & Cardiac Risk Stratification - 09/18/16 1435      Activity Barriers & Cardiac Risk Stratification   Activity  Barriers None   Cardiac Risk Stratification High      6 Minute Walk:     6 Minute Walk    Row Name 09/18/16 1419         6 Minute Walk   Phase Initial     Distance 1650 feet     Walk Time 6 minutes     # of Rest Breaks 0     MPH 3.12     METS 3.39     RPE 10     Perceived Dyspnea  10     VO2 Peak 16.09     Symptoms No     Resting HR 68 bpm     Resting BP 120/58     Max Ex. HR 110 bpm     Max Ex. BP 154/76     2 Minute Post BP 132/72        Initial Exercise Prescription:     Initial Exercise Prescription - 09/18/16 1400      Date of Initial Exercise RX and Referring Provider   Date 09/18/16   Referring Provider Dr. Domenic Polite     Treadmill   MPH 1.3   Grade 0   Minutes 15   METs 1.9     NuStep   Level 2   Watts 15   Minutes 20   METs 1.9     Prescription Details   Frequency (times per week) 3   Duration Progress to 30 minutes of continuous aerobic without signs/symptoms of physical distress     Intensity   THRR REST +  30   THRR 40-80% of Max Heartrate [phone]   Ratings of Perceived Exertion 11-13   Perceived Dyspnea 0-4     Progression   Progression Continue progressive overload as per policy without signs/symptoms or physical distress.     Resistance Training   Training Prescription Yes   Weight 1   Reps 10-12      Perform Capillary Blood Glucose checks as needed.  Exercise Prescription Changes:      Exercise Prescription Changes    Row Name 12/03/16 1200             Exercise Review   Progression Yes         Response to Exercise   Blood Pressure (Admit) 90/48       Blood Pressure (Exercise) 110/50       Blood Pressure (Exit) 90/50       Heart Rate (Admit) 93 bpm       Heart Rate (Exercise) 116 bpm       Heart Rate (Exit) 102 bpm       Rating of Perceived Exertion (Exercise) 11       Duration Progress to 30 minutes of continuous aerobic without signs/symptoms of physical  distress       Intensity Rest + 30          Progression   Progression Continue progressive overload as per policy without signs/symptoms or physical distress.         Resistance Training   Training Prescription Yes       Weight 5       Reps 10-12         Treadmill   MPH 3.3       Grade 1       Minutes 15       METs 4         NuStep   Level 4       Watts 37       Minutes 20       METs 3.45         Home Exercise Plan   Plans to continue exercise at Home       Frequency Add 2 additional days to program exercise sessions.          Exercise Comments:      Exercise Comments    Row Name 12/03/16 1531           Exercise Comments Patient is progressing appropriately            Discharge Exercise Prescription (Final Exercise Prescription Changes):     Exercise Prescription Changes - 12/03/16 1200      Exercise Review   Progression Yes     Response to Exercise   Blood Pressure (Admit) 90/48   Blood Pressure (Exercise) 110/50   Blood Pressure (Exit) 90/50   Heart Rate (Admit) 93 bpm   Heart Rate (Exercise) 116 bpm   Heart Rate (Exit) 102 bpm   Rating of Perceived Exertion (Exercise) 11   Duration Progress to 30 minutes of continuous aerobic without signs/symptoms of physical distress   Intensity Rest + 30     Progression   Progression Continue progressive overload as per policy without signs/symptoms or physical distress.     Resistance Training   Training Prescription Yes   Weight 5   Reps 10-12     Treadmill   MPH 3.3   Grade 1   Minutes 15   METs 4     NuStep   Level 4   Watts 37   Minutes 20   METs 3.45     Home Exercise Plan   Plans to continue exercise at Home   Frequency Add 2 additional days to program exercise sessions.      Nutrition:  Target Goals: Understanding of nutrition guidelines, daily intake of sodium 1500mg , cholesterol 200mg , calories 30% from fat and 7% or less from saturated fats, daily to have 5 or more servings of fruits and vegetables.  Biometrics:      Pre Biometrics - 09/18/16 1422      Pre Biometrics   Waist Circumference 32 inches   Hip Circumference 39.5 inches   Waist to Hip Ratio 0.81 %   BMI (Calculated) 23.5   Triceps Skinfold 18 mm   % Body Fat 32.3 %   Grip Strength 78 kg   Flexibility 14.16 in   Single Leg Stand 30 seconds       Nutrition Therapy Plan and Nutrition Goals:   Nutrition Discharge: Rate Your Plate Scores:     Nutrition Assessments - 09/18/16 1439      MEDFICTS Scores   Pre Score 21      Nutrition Goals  Re-Evaluation:   Psychosocial: Target Goals: Acknowledge presence or absence of depression, maximize coping skills, provide positive support system. Participant is able to verbalize types and ability to use techniques and skills needed for reducing stress and depression.  Initial Review & Psychosocial Screening:     Initial Psych Review & Screening - 09/18/16 [place]? Yes     Barriers   Psychosocial barriers to participate in program --  No psychosocial barriers identified.      Screening Interventions   Interventions Encouraged to exercise  Patient is not depressed and does not need counseling.       Quality of Life Scores:     Quality of Life - 09/18/16 1423      Quality of Life Scores   Health/Function Pre 26.6 %   Socioeconomic Pre 27.06 %   Psych/Spiritual Pre 26.21 %   Family Pre 28.5 %   GLOBAL Pre 26.9 %      PHQ-9: Recent Review Flowsheet Data    Depression screen [facility] 2/9 09/18/2016   Decreased Interest 0   Down, Depressed, Hopeless 0   PHQ - 2 Score 0   Altered sleeping 1   Tired, decreased energy 0   Change in appetite 1   Feeling bad or failure about yourself  0   Trouble concentrating 0   Moving slowly or fidgety/restless 0   Suicidal thoughts 0   PHQ-9 Score 2   Difficult doing work/chores Not difficult at all      Psychosocial Evaluation and Intervention:     Psychosocial Evaluation - 09/18/16 1445       Psychosocial Evaluation & Interventions   Interventions Encouraged to exercise with the program and follow exercise prescription      Psychosocial Re-Evaluation:     Psychosocial Re-Evaluation    [place] Name 12/09/16 1243             Psychosocial Re-Evaluation   Interventions Encouraged to attend Cardiac Rehabilitation for the exercise       Comments Patient's QOL score was 26.90 and her PHQ-9 score was 2. She continues to have no psychosoical issues identified.        Continued Psychosocial Services Needed No          Vocational Rehabilitation: Provide vocational rehab assistance to qualifying candidates.   Vocational Rehab Evaluation & Intervention:     Vocational Rehab - 09/18/16 1438      Initial Vocational Rehab Evaluation & Intervention   Assessment shows need for Vocational Rehabilitation No      Education: Education Goals: Education classes will be provided on a weekly basis, covering required topics. Participant will state understanding/return demonstration of topics presented.  Learning Barriers/Preferences:     Learning Barriers/Preferences - 09/18/16 1436      Learning Barriers/Preferences   Learning Barriers None   Learning Preferences Verbal Instruction;Skilled Demonstration      Education Topics: Hypertension, Hypertension Reduction -Define heart disease and high blood pressure. Discus how high blood pressure affects the body and ways to reduce high blood pressure. Flowsheet Row CARDIAC REHAB PHASE II EXERCISE from 12/02/2016 in [place]  Date  10/28/16  Educator  DC  Instruction Review Code  2- meets goals/outcomes      Exercise and Your Heart -Discuss why it is important to exercise, the FITT principles of exercise, normal and abnormal responses to exercise, and how to exercise safely. [place] REHAB PHASE  II EXERCISE from 12/02/2016 in [place]  Date  11/04/16  Educator   Russella Dar  Instruction Review Code  2- meets goals/outcomes      Angina -Discuss definition of angina, causes of angina, treatment of angina, and how to decrease risk of having angina. Flowsheet Row CARDIAC REHAB PHASE II EXERCISE from 12/02/2016 in [place]  Date  11/11/16  Educator  Russella Dar  Instruction Review Code  2- meets goals/outcomes      Cardiac Medications -Review what the following cardiac medications are used for, how they affect the body, and side effects that may occur when taking the medications.  Medications include Aspirin, Beta blockers, calcium channel blockers, ACE Inhibitors, angiotensin receptor blockers, diuretics, digoxin, and antihyperlipidemics. Flowsheet Row CARDIAC REHAB PHASE II EXERCISE from 12/02/2016 in [place]  Date  11/18/16  Educator  Nils Flack  Instruction Review Code  2- meets goals/outcomes      Congestive Heart Failure -Discuss the definition of CHF, how to live with CHF, the signs and symptoms of CHF, and how keep track of weight and sodium intake. Flowsheet Row CARDIAC REHAB PHASE II EXERCISE from 12/02/2016 in [place]  Date  11/25/16  Educator  Lisabeth Register  Instruction Review Code  2- meets goals/outcomes      Heart Disease and Intimacy -Discus the effect sexual activity has on the heart, how changes occur during intimacy as we age, and safety during sexual activity. Flowsheet Row CARDIAC REHAB PHASE II EXERCISE from 12/02/2016 in [place]  Date  12/02/16  Educator  Nils Flack  Instruction Review Code  2- meets goals/outcomes      Smoking Cessation / COPD -Discuss different methods to quit smoking, the health benefits of quitting smoking, and the definition of COPD.   Nutrition I: Fats -Discuss the types of cholesterol, what cholesterol does to the heart, and how cholesterol levels can be controlled.   Nutrition II:  Labels -Discuss the different components of food labels and how to read food label [place] from 12/02/2016 in [place]  Date  09/23/16  Educator  Russella Dar  Instruction Review Code  2- meets goals/outcomes      Heart Parts and Heart Disease -Discuss the anatomy of the heart, the pathway of blood circulation through the heart, and these are affected by heart disease. Flowsheet Row CARDIAC REHAB PHASE II EXERCISE from 12/02/2016 in [place]  Date  09/30/16  Educator  DC  Instruction Review Code  2- meets goals/outcomes      Stress I: Signs and Symptoms -Discuss the causes of stress, how stress may lead to anxiety and depression, and ways to limit stress. Flowsheet Row CARDIAC REHAB PHASE II EXERCISE from 12/02/2016 in [place]  Date  10/07/16  Educator  Russella Dar  Instruction Review Code  2- meets goals/outcomes      Stress II: Relaxation -Discuss different types of relaxation techniques to limit stress. Flowsheet Row CARDIAC REHAB PHASE II EXERCISE from 12/02/2016 in [place]  Date  10/14/16  Educator  Russella Dar  Instruction Review Code  2- meets goals/outcomes      Warning Signs of Stroke / TIA -Discuss definition of a stroke, what the signs and symptoms are of a stroke, and how to identify when someone is having stroke. Flowsheet Row CARDIAC REHAB PHASE II EXERCISE from 12/02/2016 in [place]  [facility]  Date  10/21/16  Educator  Russella Dar  Instruction Review Code  2- meets goals/outcomes      Knowledge Questionnaire Score:     Knowledge Questionnaire Score - 09/18/16 1438      Knowledge Questionnaire Score   Pre Score 26/28      Core Components/Risk Factors/Patient Goals at Admission:     Personal Goals and Risk Factors at Admission - 09/18/16 1439      Core Components/Risk Factors/Patient Goals on  Admission    Weight Management Weight Maintenance   Increase Strength and Stamina Yes   Intervention Provide advice, education, support and counseling about physical activity/exercise needs.;Develop an individualized exercise prescription for aerobic and resistive training based on initial evaluation findings, risk stratification, comorbidities and participant's personal goals.   Expected Outcomes Achievement of increased cardiorespiratory fitness and enhanced flexibility, muscular endurance and strength shown through measurements of functional capacity and personal statement of participant.   Diabetes Yes   Intervention Provide education about signs/symptoms and action to take for hypo/hyperglycemia.;Provide education about proper nutrition, including hydration, and aerobic/resistive exercise prescription along with prescribed medications to achieve blood glucose in normal ranges: Fasting glucose 65-99 mg/dL   Expected Outcomes Long Term: Attainment of HbA1C < 7%.   Lipids Yes   Intervention Provide education and support for participant on nutrition & aerobic/resistive exercise along with prescribed medications to achieve LDL 70mg , HDL >40mg .   Expected Outcomes Short Term: Participant states understanding of desired cholesterol values and is compliant with medications prescribed. Participant is following exercise prescription and nutrition guidelines.;Long Term: Cholesterol controlled with medications as prescribed, with individualized exercise RX and with personalized nutrition plan. Value goals: LDL < 70mg , HDL > 40 mg.   Personal Goal Other Yes   Personal Goal Learn more about diet and nutrition. Get on an exercise routine and live a long life.    Intervention Attend cardiac rehab sessions 3 days/week and supplement with exercise at home 2 days/week.   Expected Outcomes Patient will complete the program meeting the above stated goals.       Core Components/Risk Factors/Patient Goals Review:       Goals and Risk Factor Review    Row Name 09/18/16 1443 12/09/16 1241           Core Components/Risk Factors/Patient Goals Review   Personal Goals Review Weight Management/Obesity;Increase Strength and Stamina;Lipids;Diabetes Weight Management/Obesity  Learn more about diet and nutrition. Get on an exercise routine and live a long life.       Review  - Patient has attended 30 sessions. Patient has maintained her weight. She is progressing well in the program with increased strength and stamina.      Expected Outcomes  - Patient will complete the program meeting her personal goals.          Core Components/Risk Factors/Patient Goals at Discharge (Final Review):      Goals and Risk Factor Review - 12/09/16 1241      Core Components/Risk Factors/Patient Goals Review   Personal Goals Review Weight Management/Obesity  Learn more about diet and nutrition. Get on an exercise routine and live a long life.    Review Patient has attended 30 sessions. Patient has maintained her weight. She is progressing well in the program with increased strength and stamina.   Expected Outcomes Patient will complete the program meeting her personal goals.       ITP Comments:   Comments: ITP 30 Day REVIEW Patient doing well in  the program. Will continue to monitor for progress.

## 2016-12-09 NOTE — Progress Notes (Signed)
Daily Session Note  Patient Details  Name: Rhonda Allen MRN: [id number] Date of Birth: 05-30-1956 Referring Provider:   Flowsheet Row CARDIAC REHAB PHASE II ORIENTATION from 09/18/2016 in [place]  Referring Provider  Dr. Domenic Polite      Encounter Date: 12/09/2016  Check In:     Session Check In - 12/09/16 1545      Check-In   Location [facility]   Staff Present Aundra Dubin, RN, BSN;Andras Grunewald Luther Parody, BS, EP, Exercise Physiologist   Supervising physician immediately available to respond to emergencies See telemetry face sheet for immediately available MD   Medication changes reported     No   Fall or balance concerns reported    No   Warm-up and Cool-down Performed as group-led instruction   Resistance Training Performed Yes   VAD Patient? No     Pain Assessment   Currently in Pain? No/denies   Pain Score 0-No pain   Multiple Pain Sites No      Capillary Blood Glucose: No results found for this or any previous visit (from the past 24 hour(s)).   Goals Met:  Independence with exercise equipment Exercise tolerated well No report of cardiac concerns or symptoms Strength training completed today  Goals Unmet:  Not Applicable  Comments: Check out 445   Dr. Kate Sable is Medical Director for [place] and Pulmonary Rehab.

## 2016-12-11 ENCOUNTER — Encounter (HOSPITAL_COMMUNITY)
Admission: RE | Admit: 2016-12-11 | Discharge: 2016-12-11 | Disposition: A | Discharge status: Home / Self Care | Payer: 59 | Source: Ambulatory Visit | Attending: Cardiology | Admitting: Cardiology

## 2016-12-11 DIAGNOSIS — I214 Non-ST elevation (NSTEMI) myocardial infarction: Secondary | ICD-10-CM

## 2016-12-11 NOTE — Progress Notes (Signed)
Daily Session Note  Patient Details  Name: Rhonda Allen MRN: [id number] Date of Birth: May 08, 1956 Referring Provider:   Flowsheet Row CARDIAC REHAB PHASE II ORIENTATION from 09/18/2016 in [place]  Referring Provider  Dr. Domenic Polite      Encounter Date: 12/11/2016  Check In:     Session Check In - 12/11/16 1541      Check-In   Location [facility]   Staff Present Suzanne Boron, BS, EP, Exercise Physiologist;Dilraj Killgore Wynetta Emery, RN, BSN   Supervising physician immediately available to respond to emergencies See telemetry face sheet for immediately available MD   Medication changes reported     No   Fall or balance concerns reported    No   Warm-up and Cool-down Performed as group-led instruction   Resistance Training Performed Yes   VAD Patient? No     Pain Assessment   Currently in Pain? No/denies   Pain Score 0-No pain   Multiple Pain Sites No      Capillary Blood Glucose: No results found for this or any previous visit (from the past 24 hour(s)).   Goals Met:  Independence with exercise equipment Changing diet to healthy choices, watching portion sizes No report of cardiac concerns or symptoms Strength training completed today  Goals Unmet:  Not Applicable  Comments: Check out 1645.   Dr. Kate Sable is Medical Director for [facility] Cardiac and Pulmonary Rehab.

## 2016-12-14 ENCOUNTER — Encounter (HOSPITAL_COMMUNITY)
Admission: RE | Admit: 2016-12-14 | Discharge: 2016-12-14 | Disposition: A | Discharge status: Home / Self Care | Payer: 59 | Source: Ambulatory Visit | Attending: Cardiology | Admitting: Cardiology

## 2016-12-14 DIAGNOSIS — Z955 Presence of coronary angioplasty implant and graft: Secondary | ICD-10-CM

## 2016-12-14 DIAGNOSIS — I214 Non-ST elevation (NSTEMI) myocardial infarction: Secondary | ICD-10-CM

## 2016-12-14 NOTE — Progress Notes (Signed)
Daily Session Note  Patient Details  Name: Rhonda Allen MRN: [id number] Date of Birth: [birth year] Referring Provider:   Flowsheet Row CARDIAC REHAB PHASE II ORIENTATION from 09/18/2016 in [place]  Referring Provider  Dr. Domenic Polite      Encounter Date: 12/14/2016  Check In:     Session Check In - 12/14/16 1545      Check-In   Location [facility]   Staff Present Leia Coletti Angelina Pih, MS, EP, [facility], Exercise Physiologist;Debra Wynetta Emery, RN, BSN   Supervising physician immediately available to respond to emergencies See telemetry face sheet for immediately available MD   Medication changes reported     No   Fall or balance concerns reported    No   Warm-up and Cool-down Performed as group-led instruction   Resistance Training Performed Yes   VAD Patient? No     Pain Assessment   Currently in Pain? No/denies   Pain Score 0-No pain   Multiple Pain Sites No      Capillary Blood Glucose: No results found for this or any previous visit (from the past 24 hour(s)).   Goals Met:  Independence with exercise equipment Exercise tolerated well Personal goals reviewed Strength training completed today  Goals Unmet:  Not Applicable  Comments: Check out: 4:45   Dr. Kate Sable is Medical Director for [place] and Pulmonary Rehab.

## 2016-12-16 ENCOUNTER — Encounter (HOSPITAL_COMMUNITY)
Admission: RE | Admit: 2016-12-16 | Discharge: 2016-12-16 | Disposition: A | Discharge status: Home / Self Care | Payer: 59 | Source: Ambulatory Visit | Attending: Cardiology | Admitting: Cardiology

## 2016-12-16 DIAGNOSIS — I214 Non-ST elevation (NSTEMI) myocardial infarction: Secondary | ICD-10-CM

## 2016-12-16 NOTE — Progress Notes (Signed)
Daily Session Note  Patient Details  Name: Rhonda Allen MRN: [id number] Date of Birth: [birth year] Referring Provider:   Flowsheet Row CARDIAC REHAB PHASE II ORIENTATION from 09/18/2016 in [place]  Referring Provider  Dr. Domenic Polite      Encounter Date: 12/16/2016  Check In:     Session Check In - 12/16/16 1545      Check-In   Location [facility]   Staff Present Diane Angelina Pih, MS, EP, [facility], Exercise Physiologist;Shamariah Shewmake Wynetta Emery, RN, BSN   Supervising physician immediately available to respond to emergencies See telemetry face sheet for immediately available MD   Medication changes reported     No   Fall or balance concerns reported    No   Warm-up and Cool-down Performed as group-led instruction   Resistance Training Performed Yes   VAD Patient? No     Pain Assessment   Currently in Pain? No/denies   Pain Score 0-No pain   Multiple Pain Sites No      Capillary Blood Glucose: No results found for this or any previous visit (from the past 24 hour(s)).   Goals Met:  Independence with exercise equipment Exercise tolerated well No report of cardiac concerns or symptoms Strength training completed today  Goals Unmet:  Not Applicable  Comments: Check out 1645.   Dr. Kate Sable is Medical Director for [facility] Cardiac and Pulmonary Rehab.

## 2016-12-18 ENCOUNTER — Encounter (HOSPITAL_COMMUNITY)
Admission: RE | Admit: 2016-12-18 | Discharge: 2016-12-18 | Disposition: A | Discharge status: Home / Self Care | Payer: 59 | Source: Ambulatory Visit | Attending: Cardiology | Admitting: Cardiology

## 2016-12-18 DIAGNOSIS — I214 Non-ST elevation (NSTEMI) myocardial infarction: Secondary | ICD-10-CM | POA: Diagnosis not present

## 2016-12-18 NOTE — Progress Notes (Signed)
Daily Session Note  Patient Details  Name: Rhonda Allen MRN: [id number] Date of Birth: Jul 16, 1956 Referring Provider:   Flowsheet Row CARDIAC REHAB PHASE II ORIENTATION from 09/18/2016 in [place]  Referring Provider  Dr. Domenic Polite      Encounter Date: 12/18/2016  Check In:     Session Check In - 12/18/16 0645      Check-In   Location [facility]   Staff Present Suzanne Boron, BS, EP, Exercise Physiologist;Yarelly Kuba Wynetta Emery, RN, BSN   Supervising physician immediately available to respond to emergencies See telemetry face sheet for immediately available MD   Medication changes reported     No   Fall or balance concerns reported    No   Warm-up and Cool-down Performed as group-led instruction   Resistance Training Performed Yes   VAD Patient? No     Pain Assessment   Currently in Pain? No/denies   Pain Score 0-No pain   Multiple Pain Sites No      Capillary Blood Glucose: No results found for this or any previous visit (from the past 24 hour(s)).   Goals Met:  Independence with exercise equipment Exercise tolerated well No report of cardiac concerns or symptoms Strength training completed today  Goals Unmet:  Not Applicable  Comments: Check out 745.   Dr. Kate Sable is Medical Director for [facility] Cardiac and Pulmonary Rehab.

## 2016-12-21 ENCOUNTER — Encounter (HOSPITAL_COMMUNITY): Payer: 59

## 2016-12-23 ENCOUNTER — Encounter (HOSPITAL_COMMUNITY)
Admission: RE | Admit: 2016-12-23 | Discharge: 2016-12-23 | Disposition: A | Discharge status: Home / Self Care | Payer: 59 | Source: Ambulatory Visit | Attending: Cardiology | Admitting: Cardiology

## 2016-12-23 DIAGNOSIS — I214 Non-ST elevation (NSTEMI) myocardial infarction: Secondary | ICD-10-CM | POA: Diagnosis not present

## 2016-12-23 LAB — GLUCOSE, CAPILLARY
GLUCOSE-CAPILLARY: 29 mg/dL — AB (ref 65–99)
GLUCOSE-CAPILLARY: 40 mg/dL — AB (ref 65–99)
Glucose-Capillary: 79 mg/dL (ref 65–99)

## 2016-12-23 NOTE — Progress Notes (Signed)
Incomplete Session Note  Patient Details  Name: Rhonda Allen MRN: [id number] Date of Birth: Mar 12, 1956 Referring Provider:   Flowsheet Row CARDIAC REHAB PHASE II ORIENTATION from 09/18/2016 in [place]  Referring Provider  Dr. Eddie Dibbles did not complete her rehab session.  Patient was noted to be confused and diapheric. Checked her glucose: 29. Patient given soft drink. Rapid response nurse Emeline Darling, RN came and evaluated patient. Rechecked glucose: 40. Patient given another soda and some peanut butter crackers. Glucose rechecked: 79. Patient was alert and orientated and felt better. Instructed patient to eat immediately when she got home and to follow her sliding scale insulin dose.

## 2016-12-24 ENCOUNTER — Encounter: Payer: Self-pay | Admitting: Adult Health

## 2016-12-24 ENCOUNTER — Ambulatory Visit (INDEPENDENT_AMBULATORY_CARE_PROVIDER_SITE_OTHER): Payer: 59 | Admitting: Adult Health

## 2016-12-24 VITALS — BP 122/66 | HR 81 | Ht 68.0 in | Wt 154.0 lb

## 2016-12-24 DIAGNOSIS — I10 Essential (primary) hypertension: Secondary | ICD-10-CM

## 2016-12-24 DIAGNOSIS — I2510 Atherosclerotic heart disease of native coronary artery without angina pectoris: Secondary | ICD-10-CM

## 2016-12-24 DIAGNOSIS — E7800 Pure hypercholesterolemia, unspecified: Secondary | ICD-10-CM

## 2016-12-24 NOTE — Progress Notes (Signed)
Name: Rhonda Allen    DOB: 10-13-1956  Age: 60 y.o.  MR#: [id number]       PCP:  Precious Reel, MD      Insurance: Payor: [employer] / Plan: [employer] OTHER / Product Type: *No Product type* /   CC:   No chief complaint on file.   VS Vitals:   12/24/16 1359  BP: 122/66  Pulse: 81  SpO2: 96%  Weight: 154 lb (69.9 kg)  Height: 5\' 8"  (1.727 m)    Weights Current Weight  12/24/16 154 lb (69.9 kg)  09/18/16 154 lb 6.4 oz (70 kg)  09/01/16 154 lb (69.9 kg)    Blood Pressure  BP Readings from Last 3 Encounters:  12/24/16 122/66  09/18/16 (!) 120/58  09/01/16 110/60     Admit date:  (Not on file) Last encounter with RMR:  09/04/2016   Allergy Nickel  Current Outpatient Prescriptions  Medication Sig Dispense Refill  . ACCU-CHEK AVIVA PLUS test strip     . aspirin 81 MG tablet Take 81 mg by mouth daily.      Marland Kitchen atorvastatin (LIPITOR) 80 MG tablet Take 1 tablet (80 mg total) by mouth daily at 6 PM. 30 tablet 5  . B-D ULTRAFINE III SHORT PEN 31G X 8 MM MISC     . BD INSULIN SYRINGE ULTRAFINE 31G X 5/16" 0.3 ML MISC     . Ferrous Sulfate (IRON) 325 (65 FE) MG TABS Take 1 tablet by mouth every Monday, Wednesday, and Friday.     . insulin glargine (LANTUS) 100 UNIT/ML injection Inject 18-19 Units into the skin at bedtime.     . insulin lispro (HUMALOG) 100 UNIT/ML injection Inject 8-15 Units into the skin 3 (three) times daily before meals. Per sliding scale    . lisinopril (PRINIVIL,ZESTRIL) 10 MG tablet Take 10 mg by mouth daily.      . metoprolol tartrate (LOPRESSOR) 25 MG tablet Take 0.5 tablets (12.5 mg total) by mouth 2 (two) times daily. 60 tablet 5  . Multiple Vitamin (MULTIVITAMIN) tablet Take 1 tablet by mouth every Monday, Wednesday, and Friday.     . nitroGLYCERIN (NITROSTAT) 0.4 MG SL tablet Place 1 tablet (0.4 mg total) under the tongue every 5 (five) minutes as needed for chest pain. 25 tablet 2  . Omega-3 Fatty Acids (FISH OIL PO) Take 1 capsule by  mouth See admin instructions. Once to twice daily    . omeprazole (PRILOSEC) 40 MG capsule TAKE ONE CAPSULE BY MOUTH DAILY 30 capsule 2  . polycarbophil (FIBERCON) 625 MG tablet Take 625 mg by mouth daily.    . ticagrelor (BRILINTA) 90 MG TABS tablet Take 1 tablet (90 mg total) by mouth 2 (two) times daily. 60 tablet 10   No current facility-administered medications for this visit.     Discontinued Meds:   There are no discontinued medications.  Patient Active Problem List   Diagnosis Date Noted  . Non-STEMI (non-ST elevated myocardial infarction) ([place]) 08/14/2016  . Insulin dependent type 2 diabetes mellitus, controlled ([place]) 08/14/2016  . NSTEMI (non-ST elevated myocardial infarction) ([place]) 08/14/2016  . Essential hypertension     LABS    Component Value Date/Time   NA 136 08/18/2016 0329   NA 140 08/16/2016 0236   NA 141 08/15/2016 0433   K 3.8 08/18/2016 0329   K 3.9 08/16/2016 0236   K 3.9 08/15/2016 0433   CL 104 08/18/2016 0329   CL 106 08/16/2016 0236  CL 110 08/15/2016 0433   CO2 23 08/18/2016 0329   CO2 24 08/16/2016 0236   CO2 25 08/15/2016 0433   GLUCOSE 171 (H) 08/18/2016 0329   GLUCOSE 174 (H) 08/16/2016 0236   GLUCOSE 136 (H) 08/15/2016 0433   BUN 11 08/18/2016 0329   BUN 13 08/16/2016 0236   BUN 12 08/15/2016 0433   CREATININE 0.86 08/18/2016 0329   CREATININE 0.75 08/16/2016 0236   CREATININE 0.75 08/15/2016 0433   CALCIUM 9.0 08/18/2016 0329   CALCIUM 8.8 (L) 08/16/2016 0236   CALCIUM 8.7 (L) 08/15/2016 0433   GFRNONAA >60 08/18/2016 0329   GFRNONAA >60 08/16/2016 0236   GFRNONAA >60 08/15/2016 0433   GFRAA >60 08/18/2016 0329   GFRAA >60 08/16/2016 0236   GFRAA >60 08/15/2016 0433   CMP     Component Value Date/Time   NA 136 08/18/2016 0329   K 3.8 08/18/2016 0329   CL 104 08/18/2016 0329   CO2 23 08/18/2016 0329   GLUCOSE 171 (H) 08/18/2016 0329   BUN 11 08/18/2016 0329   CREATININE 0.86 08/18/2016 0329   CALCIUM 9.0 08/18/2016 0329    PROT 7.2 08/14/2016 1659   ALBUMIN 4.1 08/14/2016 1659   AST 31 08/14/2016 1659   ALT 23 08/14/2016 1659   ALKPHOS 68 08/14/2016 1659   BILITOT 0.9 08/14/2016 1659   GFRNONAA >60 08/18/2016 0329   GFRAA >60 08/18/2016 0329       Component Value Date/Time   WBC 6.3 08/18/2016 0329   WBC 5.6 08/17/2016 0321   WBC 6.3 08/16/2016 0236   HGB 12.0 08/18/2016 0329   HGB 11.4 (L) 08/17/2016 0321   HGB 11.9 (L) 08/16/2016 0236   HCT 37.0 08/18/2016 0329   HCT 35.5 (L) 08/17/2016 0321   HCT 37.4 08/16/2016 0236   MCV 95.6 08/18/2016 0329   MCV 96.2 08/17/2016 0321   MCV 97.4 08/16/2016 0236    Lipid Panel     Component Value Date/Time   CHOL 216 (H) 08/15/2016 0433   TRIG 40 08/15/2016 0433   HDL 80 08/15/2016 0433   CHOLHDL 2.7 08/15/2016 0433   VLDL 8 08/15/2016 0433   LDLCALC 128 (H) 08/15/2016 0433    ABG No results found for: PHART, PCO2ART, PO2ART, HCO3, TCO2, ACIDBASEDEF, O2SAT   No results found for: TSH BNP (last 3 results) No results for input(s): BNP in the last 8760 hours.  ProBNP (last 3 results) No results for input(s): PROBNP in the last 8760 hours.  Cardiac Panel (last 3 results) No results for input(s): CKTOTAL, CKMB, TROPONINI, RELINDX in the last 72 hours.  Iron/TIBC/Ferritin/ %Sat No results found for: IRON, TIBC, FERRITIN, IRONPCTSAT   EKG Orders placed or performed during the hospital encounter of 08/14/16  . ED EKG  . ED EKG  . EKG 12-Lead  . EKG 12-Lead  . EKG 12-Lead  . EKG 12-Lead  . EKG 12-Lead  . EKG 12-Lead  . EKG 12-Lead  . EKG 12-Lead  . EKG 12-Lead  . EKG 12-Lead immediately post procedure  . EKG 12-Lead  . EKG 12-Lead  . EKG 12-Lead  . EKG 12-Lead immediately post procedure  . EKG 12-Lead     Prior Assessment and Plan Problem List as of 12/24/2016 Reviewed: 09/01/2016  4:56 PM by Jory Sims, NP     Cardiovascular and Mediastinum   Essential hypertension   Non-STEMI (non-ST elevated myocardial infarction) [facility])    NSTEMI (non-ST elevated myocardial infarction) [facility])     Endocrine  Insulin dependent type 2 diabetes mellitus, controlled ([place])       Imaging: No results found.

## 2016-12-24 NOTE — Progress Notes (Signed)
Cardiology Office Note   Date:  12/24/2016   ID:  Dimon, Zaiger 1956-01-16, MRN [id number]  PCP:  Precious Reel, MD  Cardiologist: [place]/  Jory Sims, NP   Chief Complaint  Patient presents with  . Coronary Artery Disease  . Hypertension      History of Present Illness: Rhonda Allen is a 60 y.o. female who presents for ongoing assessment and management of coronary artery disease with severe single-vessel disease of the LAD with 99% proximal stenosis per cardiac catheterization in August 2017, successful IVIS guided PCI to the proximal LAD with placement of a Resolute 3 x 5 x 15 mm drug-eluting stent.   2D Echo 07/2016 Study Conclusions  - Left ventricle: The cavity size was normal. Wall thickness was normal. Systolic function was mildly to moderately reduced. The estimated ejection fraction was in the range of 40% to 45%. There is akinesis of the mid-apicalanteroseptal, anterolateral, and apical myocardium. - Pulmonary arteries: Systolic pressure was mildly to moderately increased. PA peak pressure: 42 mm Hg (S).  He was last seen in the office on 09/01/2016, and was found to be stable from a cardiac standpoint. Anginal equivalent is substernal chest pain with profound fatigue. At that time she was walking a mile and a half a day and was asymptomatic. She is here for 3 month follow-up.  She is doing well and is without complaint of recurrent chest pain. Blood pressure and heart rate well-controlled. She denies any bleeding issues or fatigue.  Past Medical History:  Diagnosis Date  . Anemia   . Diabetes mellitus   . Diverticulosis of colon   . Frozen shoulder    right  . GERD (gastroesophageal reflux disease)   . Hemorrhoids   . Hypertension     Past Surgical History:  Procedure Laterality Date  . CARDIAC CATHETERIZATION N/A 08/17/2016   Procedure: Left Heart Cath and Coronary Angiography;  Surgeon: Nelva Bush, MD;  Location:  [place] [facility];  Service: Cardiovascular;  Laterality: N/A;  . CARDIAC CATHETERIZATION N/A 08/17/2016   Procedure: Coronary Stent Intervention;  Surgeon: Nelva Bush, MD;  Location: [place] [facility];  Service: Cardiovascular;  Laterality: N/A;  . CARDIAC CATHETERIZATION N/A 08/17/2016   Procedure: Intravascular Ultrasound/IVUS;  Surgeon: Nelva Bush, MD;  Location: [place] [facility];  Service: Cardiovascular;  Laterality: N/A;  . DILATION AND CURETTAGE OF UTERUS    . SHOULDER SURGERY     bone spur left shoulder  . WISDOM TOOTH EXTRACTION       Current Outpatient Prescriptions  Medication Sig Dispense Refill  . ACCU-CHEK AVIVA PLUS test strip     . aspirin 81 MG tablet Take 81 mg by mouth daily.      Marland Kitchen atorvastatin (LIPITOR) 80 MG tablet Take 1 tablet (80 mg total) by mouth daily at 6 PM. 30 tablet 5  . B-D ULTRAFINE III SHORT PEN 31G X 8 MM MISC     . BD INSULIN SYRINGE ULTRAFINE 31G X 5/16" 0.3 ML MISC     . Ferrous Sulfate (IRON) 325 (65 FE) MG TABS Take 1 tablet by mouth every Monday, Wednesday, and Friday.     . insulin glargine (LANTUS) 100 UNIT/ML injection Inject 18-19 Units into the skin at bedtime.     . insulin lispro (HUMALOG) 100 UNIT/ML injection Inject 8-15 Units into the skin 3 (three) times daily before meals. Per sliding scale    . lisinopril (PRINIVIL,ZESTRIL) 10 MG tablet Take 10 mg by mouth  daily.      . metoprolol tartrate (LOPRESSOR) 25 MG tablet Take 0.5 tablets (12.5 mg total) by mouth 2 (two) times daily. 60 tablet 5  . Multiple Vitamin (MULTIVITAMIN) tablet Take 1 tablet by mouth every Monday, Wednesday, and Friday.     . nitroGLYCERIN (NITROSTAT) 0.4 MG SL tablet Place 1 tablet (0.4 mg total) under the tongue every 5 (five) minutes as needed for chest pain. 25 tablet 2  . Omega-3 Fatty Acids (FISH OIL PO) Take 1 capsule by mouth See admin instructions. Once to twice daily    . omeprazole (PRILOSEC) 40 MG capsule TAKE ONE CAPSULE BY MOUTH DAILY 30  capsule 2  . polycarbophil (FIBERCON) 625 MG tablet Take 625 mg by mouth daily.    . ticagrelor (BRILINTA) 90 MG TABS tablet Take 1 tablet (90 mg total) by mouth 2 (two) times daily. 60 tablet 10   No current facility-administered medications for this visit.     Allergies:   Nickel    Social History:  The patient  reports that she has never smoked. She has never used smokeless tobacco. She reports that she does not drink alcohol or use drugs.   Family History:  The patient's family history includes Cancer in her father; Hypertension in her mother; Irritable bowel syndrome in her mother; Ulcers in her sister.    ROS: All other systems are reviewed and negative. Unless otherwise mentioned in H&P    PHYSICAL EXAM: VS:  BP 122/66   Pulse 81   Ht 5\' 8"  (1.727 m)   Wt 154 lb (69.9 kg)   SpO2 96%   BMI 23.42 kg/m  , BMI Body mass index is 23.42 kg/m. GEN: Well nourished, well developed, in no acute distress  HEENT: normal  Neck: no JVD, carotid bruits, or masses Cardiac: RRR; no murmurs, rubs, or gallops,no edema  Respiratory:  clear to auscultation bilaterally, normal work of breathing GI: soft, nontender, nondistended, + BS MS: no deformity or atrophy  Skin: warm and dry, no rash Neuro:  Strength and sensation are intact Psych: euthymic mood, full affect  Recent Labs: 08/14/2016: ALT 23 08/18/2016: BUN 11; Creatinine, Ser 0.86; Hemoglobin 12.0; Platelets 158; Potassium 3.8; Sodium 136    Lipid Panel    Component Value Date/Time   CHOL 216 (H) 08/15/2016 0433   TRIG 40 08/15/2016 0433   HDL 80 08/15/2016 0433   CHOLHDL 2.7 08/15/2016 0433   VLDL 8 08/15/2016 0433   LDLCALC 128 (H) 08/15/2016 0433      Wt Readings from Last 3 Encounters:  12/24/16 154 lb (69.9 kg)  09/18/16 154 lb 6.4 oz (70 kg)  09/01/16 154 lb (69.9 kg)     ASSESSMENT AND PLAN:  1.  Coronary artery disease: History of single-vessel disease with 99% proximal LAD with placement of drug-eluting  stent August 2017. She will continue on dual antiplatelet therapy, statin therapy, and ACE inhibitor. She has recently finished cardiac rehabilitation regimen and is feeling very well. She plans to continue using gym at work. 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 She is going to keep up with that as well. No changes in medication regimen are planned. We'll see her in 6 months  2. Hypertension: Excellent control on current medication regimen of beta blocker and ACE inhibitor.  3. Diabetes: She had one episode of hypoglycemia during cardiac rehabilitation yesterday. She admits to not eating prior to exercising. This was a  one-time episode and she is more cognizant of evaluating her blood sugar regimen and eating regularly.   Current medicines are reviewed at length with the patient today.    Labs/ tests ordered today include:  No orders of the defined types were placed in this encounter.    Disposition:   FU with 6 months unless symptomatic  Signed, Jory Sims, NP  12/24/2016 2:49 PM    [facility] [address] [place], [place] [address] Phone: [phone]; Fax: [phone]

## 2016-12-24 NOTE — Patient Instructions (Signed)
Your physician wants you to follow-up in: 6 Months with Dr. McDowell.  You will receive a reminder letter in the mail two months in advance. If you don't receive a letter, please call our office to schedule the follow-up appointment.  Your physician recommends that you continue on your current medications as directed. Please refer to the Current Medication list given to you today.  If you need a refill on your cardiac medications before your next appointment, please call your pharmacy.  Thank you for choosing [facility]! '  

## 2016-12-25 ENCOUNTER — Encounter (HOSPITAL_COMMUNITY)
Admission: RE | Admit: 2016-12-25 | Discharge: 2016-12-25 | Disposition: A | Discharge status: Home / Self Care | Payer: 59 | Source: Ambulatory Visit | Attending: Cardiology | Admitting: Cardiology

## 2016-12-25 VITALS — Ht 68.0 in | Wt 154.3 lb

## 2016-12-25 DIAGNOSIS — I214 Non-ST elevation (NSTEMI) myocardial infarction: Secondary | ICD-10-CM

## 2016-12-28 ENCOUNTER — Encounter (HOSPITAL_COMMUNITY): Payer: 59

## 2016-12-30 ENCOUNTER — Encounter (HOSPITAL_COMMUNITY): Payer: 59

## 2016-12-31 NOTE — Progress Notes (Signed)
Discharge Summary  Patient Details  Name: Rhonda Allen MRN: [id number] Date of Birth: 26-Aug-1956 Referring Provider:   Flowsheet Row CARDIAC REHAB PHASE II ORIENTATION from 09/18/2016 in [facility]  Referring Provider  Dr. Domenic Polite       Number of Visits: 36  Reason for Discharge:  Patient reached a stable level of exercise. Patient independent in their exercise.  Smoking History:  History  Smoking Status  . Never Smoker  Smokeless Tobacco  . Never Used    Diagnosis:  NSTEMI (non-ST elevated myocardial infarction) ([place])  ADL UCSD:   Initial Exercise Prescription:     Initial Exercise Prescription - 09/18/16 1400      Date of Initial Exercise RX and Referring Provider   Date 09/18/16   Referring Provider Dr. Domenic Polite     Treadmill   MPH 1.3   Grade 0   Minutes 15   METs 1.9     NuStep   Level 2   Watts 15   Minutes 20   METs 1.9     Prescription Details   Frequency (times per week) 3   Duration Progress to 30 minutes of continuous aerobic without signs/symptoms of physical distress     Intensity   THRR REST +  30   THRR 40-80% of Max Heartrate [phone]   Ratings of Perceived Exertion 11-13   Perceived Dyspnea 0-4     Progression   Progression Continue progressive overload as per policy without signs/symptoms or physical distress.     Resistance Training   Training Prescription Yes   Weight 1   Reps 10-12      Discharge Exercise Prescription (Final Exercise Prescription Changes):     Exercise Prescription Changes - 12/03/16 1200      Exercise Review   Progression Yes     Response to Exercise   Blood Pressure (Admit) 90/48   Blood Pressure (Exercise) 110/50   Blood Pressure (Exit) 90/50   Heart Rate (Admit) 93 bpm   Heart Rate (Exercise) 116 bpm   Heart Rate (Exit) 102 bpm   Rating of Perceived Exertion (Exercise) 11   Duration Progress to 30 minutes of continuous aerobic without signs/symptoms of  physical distress   Intensity Rest + 30     Progression   Progression Continue progressive overload as per policy without signs/symptoms or physical distress.     Resistance Training   Training Prescription Yes   Weight 5   Reps 10-12     Treadmill   MPH 3.3   Grade 1   Minutes 15   METs 4     NuStep   Level 4   Watts 37   Minutes 20   METs 3.45     Home Exercise Plan   Plans to continue exercise at Home   Frequency Add 2 additional days to program exercise sessions.      Functional Capacity:     6 Minute Walk    Row Name 09/18/16 1419 12/28/16 0912       6 Minute Walk   Phase Initial Discharge    Distance 1650 feet 2000 feet    Distance % Change  - 21.21 %    Walk Time 6 minutes 6 minutes    # of Rest Breaks 0 0    MPH 3.12 3.12    METS 3.39 3.78    RPE 10 11    Perceived Dyspnea  10 11    VO2 Peak 16.09 19.11  Symptoms No No    Resting HR 68 bpm 81 bpm    Resting BP 120/58 120/58    Max Ex. HR 110 bpm 127 bpm    Max Ex. BP 154/76 160/70    2 Minute Post BP 132/72 128/64       Psychological, QOL, Others - Outcomes: PHQ 2/9: Depression screen [facility] 2/9 12/31/2016 09/18/2016  Decreased Interest 0 0  Down, Depressed, Hopeless 0 0  PHQ - 2 Score 0 0  Altered sleeping 1 1  Tired, decreased energy 0 0  Change in appetite 0 1  Feeling bad or failure about yourself  0 0  Trouble concentrating 0 0  Moving slowly or fidgety/restless 0 0  Suicidal thoughts 0 0  PHQ-9 Score 1 2  Difficult doing work/chores Not difficult at all Not difficult at all    Quality of Life:     Quality of Life - 12/28/16 0917      Quality of Life Scores   Health/Function Pre 26.6 %   Health/Function Post 24.6 %   Health/Function % Change -7.52 %   Socioeconomic Pre 27.06 %   Socioeconomic Post 25.71 %   Socioeconomic % Change  -4.99 %   Psych/Spiritual Pre 26.21 %   Psych/Spiritual Post 24 %   Psych/Spiritual % Change -8.43 %   Family Pre 28.5 %   Family Post 27.6 %    Family % Change -3.16 %   GLOBAL Pre 26.9 %   GLOBAL Post 25.16 %   GLOBAL % Change -6.47 %      Personal Goals: Goals established at orientation with interventions provided to work toward goal.     Personal Goals and Risk Factors at Admission - 09/18/16 1439      Core Components/Risk Factors/Patient Goals on Admission    Weight Management Weight Maintenance   Increase Strength and Stamina Yes   Intervention Provide advice, education, support and counseling about physical activity/exercise needs.;Develop an individualized exercise prescription for aerobic and resistive training based on initial evaluation findings, risk stratification, comorbidities and participant's personal goals.   Expected Outcomes Achievement of increased cardiorespiratory fitness and enhanced flexibility, muscular endurance and strength shown through measurements of functional capacity and personal statement of participant.   Diabetes Yes   Intervention Provide education about signs/symptoms and action to take for hypo/hyperglycemia.;Provide education about proper nutrition, including hydration, and aerobic/resistive exercise prescription along with prescribed medications to achieve blood glucose in normal ranges: Fasting glucose 65-99 mg/dL   Expected Outcomes Long Term: Attainment of HbA1C < 7%.   Lipids Yes   Intervention Provide education and support for participant on nutrition & aerobic/resistive exercise along with prescribed medications to achieve LDL 70mg , HDL >40mg .   Expected Outcomes Short Term: Participant states understanding of desired cholesterol values and is compliant with medications prescribed. Participant is following exercise prescription and nutrition guidelines.;Long Term: Cholesterol controlled with medications as prescribed, with individualized exercise RX and with personalized nutrition plan. Value goals: LDL < 70mg , HDL > 40 mg.   Personal Goal Other Yes   Personal Goal Learn more about  diet and nutrition. Get on an exercise routine and live a long life.    Intervention Attend cardiac rehab sessions 3 days/week and supplement with exercise at home 2 days/week.   Expected Outcomes Patient will complete the program meeting the above stated goals.        Personal Goals Discharge:     Goals and Risk Factor Review    Row Name  09/18/16 1443 12/09/16 1241 12/31/16 0825         Core Components/Risk Factors/Patient Goals Review   Personal Goals Review Weight Management/Obesity;Increase Strength and Stamina;Lipids;Diabetes Weight Management/Obesity  Learn more about diet and nutrition. Get on an exercise routine and live a long life.  Weight Management/Obesity;Increase Strength and Stamina  Learn more about nutrition and get on an exercise program.      Review  - Patient has attended 30 sessions. Patient has maintained her weight. She is progressing well in the program with increased strength and stamina. Upon graduation, patient lost 1.9 lbs. Her strength and stamina did increase. She says she feels better after completing the program and plans to continue to exercise. Her Medficts score improved.     Expected Outcomes  - Patient will complete the program meeting her personal goals.  Patient will continue to exercise maintaining her weight with increased strength and stamina.         Nutrition & Weight - Outcomes:     Pre Biometrics - 09/18/16 1422      Pre Biometrics   Waist Circumference 32 inches   Hip Circumference 39.5 inches   Waist to Hip Ratio 0.81 %   BMI (Calculated) 23.5   Triceps Skinfold 18 mm   % Body Fat 32.3 %   Grip Strength 78 kg   Flexibility 14.16 in   Single Leg Stand 30 seconds         Post Biometrics - 12/28/16 0916       Post  Biometrics   Height 5\' 8"  (1.727 m)   Weight 154 lb 5.2 oz (70 kg)   Waist Circumference 30.5 inches   Hip Circumference 37 inches   Waist to Hip Ratio 0.82 %   BMI (Calculated) 23.5   Triceps Skinfold 18 mm    % Body Fat 31.8 %   Grip Strength 67.3 kg   Flexibility 16.83 in   Single Leg Stand 60 seconds      Nutrition:   Nutrition Discharge:     Nutrition Assessments - 12/31/16 0825      MEDFICTS Scores   Pre Score 21   Post Score 9   Score Difference -12      Education Questionnaire Score:     Knowledge Questionnaire Score - 12/31/16 0824      Knowledge Questionnaire Score   Pre Score 26/28   Post Score 24/24      Goals reviewed with patient; copy given to patient.

## 2016-12-31 NOTE — Progress Notes (Signed)
Cardiac Individual Treatment Plan  Patient Details  Name: Rhonda Allen MRN: [id number] Date of Birth: May 07, 1956 Referring Provider:   Flowsheet Row CARDIAC REHAB PHASE II ORIENTATION from 09/18/2016 in [place]  Referring Provider  Dr. Domenic Polite      Initial Encounter Date:  Flowsheet Row CARDIAC REHAB PHASE II ORIENTATION from 09/18/2016 in [place]  Date  09/18/16  Referring Provider  Dr. Domenic Polite      Visit Diagnosis: NSTEMI (non-ST elevated myocardial infarction) [facility])  Patient's Home Medications on Admission:  Current Outpatient Prescriptions:  .  ACCU-CHEK AVIVA PLUS test strip, , Disp: , Rfl:  .  aspirin 81 MG tablet, Take 81 mg by mouth daily.  , Disp: , Rfl:  .  atorvastatin (LIPITOR) 80 MG tablet, Take 1 tablet (80 mg total) by mouth daily at 6 PM., Disp: 30 tablet, Rfl: 5 .  B-D ULTRAFINE III SHORT PEN 31G X 8 MM MISC, , Disp: , Rfl:  .  BD INSULIN SYRINGE ULTRAFINE 31G X 5/16" 0.3 ML MISC, , Disp: , Rfl:  .  Ferrous Sulfate (IRON) 325 (65 FE) MG TABS, Take 1 tablet by mouth every Monday, Wednesday, and Friday. , Disp: , Rfl:  .  insulin glargine (LANTUS) 100 UNIT/ML injection, Inject 18-19 Units into the skin at bedtime. , Disp: , Rfl:  .  insulin lispro (HUMALOG) 100 UNIT/ML injection, Inject 8-15 Units into the skin 3 (three) times daily before meals. Per sliding scale, Disp: , Rfl:  .  lisinopril (PRINIVIL,ZESTRIL) 10 MG tablet, Take 10 mg by mouth daily.  , Disp: , Rfl:  .  metoprolol tartrate (LOPRESSOR) 25 MG tablet, Take 0.5 tablets (12.5 mg total) by mouth 2 (two) times daily., Disp: 60 tablet, Rfl: 5 .  Multiple Vitamin (MULTIVITAMIN) tablet, Take 1 tablet by mouth every Monday, Wednesday, and Friday. , Disp: , Rfl:  .  nitroGLYCERIN (NITROSTAT) 0.4 MG SL tablet, Place 1 tablet (0.4 mg total) under the tongue every 5 (five) minutes as needed for chest pain., Disp: 25 tablet, Rfl: 2 .  Omega-3 Fatty Acids  (FISH OIL PO), Take 1 capsule by mouth See admin instructions. Once to twice daily, Disp: , Rfl:  .  omeprazole (PRILOSEC) 40 MG capsule, TAKE ONE CAPSULE BY MOUTH DAILY, Disp: 30 capsule, Rfl: 2 .  polycarbophil (FIBERCON) 625 MG tablet, Take 625 mg by mouth daily., Disp: , Rfl:  .  ticagrelor (BRILINTA) 90 MG TABS tablet, Take 1 tablet (90 mg total) by mouth 2 (two) times daily., Disp: 60 tablet, Rfl: 10  Past Medical History: Past Medical History:  Diagnosis Date  . Anemia   . Diabetes mellitus   . Diverticulosis of colon   . Frozen shoulder    right  . GERD (gastroesophageal reflux disease)   . Hemorrhoids   . Hypertension     Tobacco Use: History  Smoking Status  . Never Smoker  Smokeless Tobacco  . Never Used    Labs: Recent Review Flowsheet Data    Labs for ITP Cardiac and Pulmonary Rehab Latest Ref Rng & Units 08/15/2016   Cholestrol 0 - 200 mg/dL 216(H)   LDLCALC 0 - 99 mg/dL 128(H)   HDL >40 mg/dL 80   Trlycerides <150 mg/dL 40      Capillary Blood Glucose: Lab Results  Component Value Date   GLUCAP 79 12/23/2016   GLUCAP 40 (LL) 12/23/2016   GLUCAP 29 (LL) 12/23/2016   GLUCAP 285 (H) 08/18/2016  GLUCAP 131 (H) 08/18/2016     Exercise Target Goals:    Exercise Program Goal: Individual exercise prescription set with THRR, safety & activity barriers. Participant demonstrates ability to understand and report RPE using BORG scale, to self-measure pulse accurately, and to acknowledge the importance of the exercise prescription.  Exercise Prescription Goal: Starting with aerobic activity 30 plus minutes a day, 3 days per week for initial exercise prescription. Provide home exercise prescription and guidelines that participant acknowledges understanding prior to discharge.  Activity Barriers & Risk Stratification:     Activity Barriers & Cardiac Risk Stratification - 09/18/16 1435      Activity Barriers & Cardiac Risk Stratification   Activity Barriers  None   Cardiac Risk Stratification High      6 Minute Walk:     6 Minute Walk    Row Name 09/18/16 1419 12/28/16 0912       6 Minute Walk   Phase Initial Discharge    Distance 1650 feet 2000 feet    Distance % Change  - 21.21 %    Walk Time 6 minutes 6 minutes    # of Rest Breaks 0 0    MPH 3.12 3.12    METS 3.39 3.78    RPE 10 11    Perceived Dyspnea  10 11    VO2 Peak 16.09 19.11    Symptoms No No    Resting HR 68 bpm 81 bpm    Resting BP 120/58 120/58    Max Ex. HR 110 bpm 127 bpm    Max Ex. BP 154/76 160/70    2 Minute Post BP 132/72 128/64       Initial Exercise Prescription:     Initial Exercise Prescription - 09/18/16 1400      Date of Initial Exercise RX and Referring Provider   Date 09/18/16   Referring Provider Dr. Domenic Polite     Treadmill   MPH 1.3   Grade 0   Minutes 15   METs 1.9     NuStep   Level 2   Watts 15   Minutes 20   METs 1.9     Prescription Details   Frequency (times per week) 3   Duration Progress to 30 minutes of continuous aerobic without signs/symptoms of physical distress     Intensity   THRR REST +  30   THRR 40-80% of Max Heartrate [id number]   Ratings of Perceived Exertion 11-13   Perceived Dyspnea 0-4     Progression   Progression Continue progressive overload as per policy without signs/symptoms or physical distress.     Resistance Training   Training Prescription Yes   Weight 1   Reps 10-12      Perform Capillary Blood Glucose checks as needed.  Exercise Prescription Changes:      Exercise Prescription Changes    Row Name 12/03/16 1200             Exercise Review   Progression Yes         Response to Exercise   Blood Pressure (Admit) 90/48       Blood Pressure (Exercise) 110/50       Blood Pressure (Exit) 90/50       Heart Rate (Admit) 93 bpm       Heart Rate (Exercise) 116 bpm       Heart Rate (Exit) 102 bpm       Rating of Perceived Exertion (Exercise) 11  Duration Progress to 30  minutes of continuous aerobic without signs/symptoms of physical distress       Intensity Rest + 30         Progression   Progression Continue progressive overload as per policy without signs/symptoms or physical distress.         Resistance Training   Training Prescription Yes       Weight 5       Reps 10-12         Treadmill   MPH 3.3       Grade 1       Minutes 15       METs 4         NuStep   Level 4       Watts 37       Minutes 20       METs 3.45         Home Exercise Plan   Plans to continue exercise at Home       Frequency Add 2 additional days to program exercise sessions.          Exercise Comments:      Exercise Comments    Row Name 12/03/16 1531           Exercise Comments Patient is progressing appropriately            Discharge Exercise Prescription (Final Exercise Prescription Changes):     Exercise Prescription Changes - 12/03/16 1200      Exercise Review   Progression Yes     Response to Exercise   Blood Pressure (Admit) 90/48   Blood Pressure (Exercise) 110/50   Blood Pressure (Exit) 90/50   Heart Rate (Admit) 93 bpm   Heart Rate (Exercise) 116 bpm   Heart Rate (Exit) 102 bpm   Rating of Perceived Exertion (Exercise) 11   Duration Progress to 30 minutes of continuous aerobic without signs/symptoms of physical distress   Intensity Rest + 30     Progression   Progression Continue progressive overload as per policy without signs/symptoms or physical distress.     Resistance Training   Training Prescription Yes   Weight 5   Reps 10-12     Treadmill   MPH 3.3   Grade 1   Minutes 15   METs 4     NuStep   Level 4   Watts 37   Minutes 20   METs 3.45     Home Exercise Plan   Plans to continue exercise at Home   Frequency Add 2 additional days to program exercise sessions.      Nutrition:  Target Goals: Understanding of nutrition guidelines, daily intake of sodium <1539m, cholesterol <2077m calories 30% from fat and 7%  or less from saturated fats, daily to have 5 or more servings of fruits and vegetables.  Biometrics:     Pre Biometrics - 09/18/16 1422      Pre Biometrics   Waist Circumference 32 inches   Hip Circumference 39.5 inches   Waist to Hip Ratio 0.81 %   BMI (Calculated) 23.5   Triceps Skinfold 18 mm   % Body Fat 32.3 %   Grip Strength 78 kg   Flexibility 14.16 in   Single Leg Stand 30 seconds         Post Biometrics - 12/28/16 0916       Post  Biometrics   Height '5\' 8"'  (1.727 m)   Weight 154 lb 5.2 oz (  70 kg)   Waist Circumference 30.5 inches   Hip Circumference 37 inches   Waist to Hip Ratio 0.82 %   BMI (Calculated) 23.5   Triceps Skinfold 18 mm   % Body Fat 31.8 %   Grip Strength 67.3 kg   Flexibility 16.83 in   Single Leg Stand 60 seconds      Nutrition Therapy Plan and Nutrition Goals:   Nutrition Discharge: Rate Your Plate Scores:     Nutrition Assessments - 12/31/16 0825      MEDFICTS Scores   Pre Score 21   Post Score 9   Score Difference -12      Nutrition Goals Re-Evaluation:   Psychosocial: Target Goals: Acknowledge presence or absence of depression, maximize coping skills, provide positive support system. Participant is able to verbalize types and ability to use techniques and skills needed for reducing stress and depression.  Initial Review & Psychosocial Screening:     Initial Psych Review & Screening - 09/18/16 [place]? Yes     Barriers   Psychosocial barriers to participate in program --  No psychosocial barriers identified.      Screening Interventions   Interventions Encouraged to exercise  Patient is not depressed and does not need counseling.       Quality of Life Scores:     Quality of Life - 12/28/16 0917      Quality of Life Scores   Health/Function Pre 26.6 %   Health/Function Post 24.6 %   Health/Function % Change -7.52 %   Socioeconomic Pre 27.06 %   Socioeconomic Post  25.71 %   Socioeconomic % Change  -4.99 %   Psych/Spiritual Pre 26.21 %   Psych/Spiritual Post 24 %   Psych/Spiritual % Change -8.43 %   Family Pre 28.5 %   Family Post 27.6 %   Family % Change -3.16 %   GLOBAL Pre 26.9 %   GLOBAL Post 25.16 %   GLOBAL % Change -6.47 %      PHQ-9: Recent Review Flowsheet Data    Depression screen [facility] 2/9 12/31/2016 09/18/2016   Decreased Interest 0 0   Down, Depressed, Hopeless 0 0   PHQ - 2 Score 0 0   Altered sleeping 1 1   Tired, decreased energy 0 0   Change in appetite 0 1   Feeling bad or failure about yourself  0 0   Trouble concentrating 0 0   Moving slowly or fidgety/restless 0 0   Suicidal thoughts 0 0   PHQ-9 Score 1 2   Difficult doing work/chores Not difficult at all Not difficult at all      Psychosocial Evaluation and Intervention:     Psychosocial Evaluation - 12/31/16 0829      Discharge Psychosocial Assessment & Intervention   Comments Patient has no psychosocial issues identified at discharge.       Psychosocial Re-Evaluation:     Psychosocial Re-Evaluation    [place] Name 12/09/16 1243             Psychosocial Re-Evaluation   Interventions Encouraged to attend Cardiac Rehabilitation for the exercise       Comments Patient's QOL score was 26.90 and her PHQ-9 score was 2. She continues to have no psychosoical issues identified.        Continued Psychosocial Services Needed No          Vocational Rehabilitation: Provide vocational rehab assistance  to qualifying candidates.   Vocational Rehab Evaluation & Intervention:     Vocational Rehab - 09/18/16 1438      Initial Vocational Rehab Evaluation & Intervention   Assessment shows need for Vocational Rehabilitation No      Education: Education Goals: Education classes will be provided on a weekly basis, covering required topics. Participant will state understanding/return demonstration of topics presented.  Learning Barriers/Preferences:      Learning Barriers/Preferences - 09/18/16 1436      Learning Barriers/Preferences   Learning Barriers None   Learning Preferences Verbal Instruction;Skilled Demonstration      Education Topics: Hypertension, Hypertension Reduction -Define heart disease and high blood pressure. Discus how high blood pressure affects the body and ways to reduce high blood pressure. Flowsheet Row CARDIAC REHAB PHASE II EXERCISE from 12/23/2016 in [place]  Date  10/28/16  Educator  DC  Instruction Review Code  2- meets goals/outcomes      Exercise and Your Heart -Discuss why it is important to exercise, the FITT principles of exercise, normal and abnormal responses to exercise, and how to exercise safely. Flowsheet Row CARDIAC REHAB PHASE II EXERCISE from 12/23/2016 in [place]  Date  11/04/16  Educator  Russella Dar  Instruction Review Code  2- meets goals/outcomes      Angina -Discuss definition of angina, causes of angina, treatment of angina, and how to decrease risk of having angina. Flowsheet Row CARDIAC REHAB PHASE II EXERCISE from 12/23/2016 in [place]  Date  11/11/16  Educator  Russella Dar  Instruction Review Code  2- meets goals/outcomes      Cardiac Medications -Review what the following cardiac medications are used for, how they affect the body, and side effects that may occur when taking the medications.  Medications include Aspirin, Beta blockers, calcium channel blockers, ACE Inhibitors, angiotensin receptor blockers, diuretics, digoxin, and antihyperlipidemics. Flowsheet Row CARDIAC REHAB PHASE II EXERCISE from 12/23/2016 in [place]  Date  11/18/16  Educator  Nils Flack  Instruction Review Code  2- meets goals/outcomes      Congestive Heart Failure -Discuss the definition of CHF, how to live with CHF, the signs and symptoms of CHF, and how keep track of weight and sodium  intake. Flowsheet Row CARDIAC REHAB PHASE II EXERCISE from 12/23/2016 in [place]  Date  11/25/16  Educator  Lisabeth Register  Instruction Review Code  2- meets goals/outcomes      Heart Disease and Intimacy -Discus the effect sexual activity has on the heart, how changes occur during intimacy as we age, and safety during sexual activity. Flowsheet Row CARDIAC REHAB PHASE II EXERCISE from 12/23/2016 in [place]  Date  12/02/16  Educator  Nils Flack  Instruction Review Code  2- meets goals/outcomes      Smoking Cessation / COPD -Discuss different methods to quit smoking, the health benefits of quitting smoking, and the definition of COPD. Flowsheet Row CARDIAC REHAB PHASE II EXERCISE from 12/23/2016 in [place]  Date  12/09/16  Educator  Russella Dar  Instruction Review Code  2- meets goals/outcomes      Nutrition I: Fats -Discuss the types of cholesterol, what cholesterol does to the heart, and how cholesterol levels can be controlled. Flowsheet Row CARDIAC REHAB PHASE II EXERCISE from 12/23/2016 in [place]  Date  12/16/16  Educator  Russella Dar  Instruction Review Code  2- meets goals/outcomes  Nutrition II: Labels -Discuss the different components of food labels and how to read food label [place] from 12/23/2016 in [place]  Date  09/23/16  Educator  Russella Dar  Instruction Review Code  2- meets goals/outcomes      Heart Parts and Heart Disease -Discuss the anatomy of the heart, the pathway of blood circulation through the heart, and these are affected by heart disease. Flowsheet Row CARDIAC REHAB PHASE II EXERCISE from 12/23/2016 in [place]  Date  09/30/16  Educator  DC  Instruction Review Code  2- meets goals/outcomes      Stress I: Signs and Symptoms -Discuss the causes of  stress, how stress may lead to anxiety and depression, and ways to limit stress. Flowsheet Row CARDIAC REHAB PHASE II EXERCISE from 12/23/2016 in [place]  Date  10/07/16  Educator  Russella Dar  Instruction Review Code  2- meets goals/outcomes      Stress II: Relaxation -Discuss different types of relaxation techniques to limit stress. Flowsheet Row CARDIAC REHAB PHASE II EXERCISE from 12/23/2016 in [place]  Date  10/14/16  Educator  Russella Dar  Instruction Review Code  2- meets goals/outcomes      Warning Signs of Stroke / TIA -Discuss definition of a stroke, what the signs and symptoms are of a stroke, and how to identify when someone is having stroke. Flowsheet Row CARDIAC REHAB PHASE II EXERCISE from 12/23/2016 in [place]  Date  10/21/16  Educator  Russella Dar  Instruction Review Code  2- meets goals/outcomes      Knowledge Questionnaire Score:     Knowledge Questionnaire Score - 12/31/16 2536      Knowledge Questionnaire Score   Pre Score 26/28   Post Score 24/24      Core Components/Risk Factors/Patient Goals at Admission:     Personal Goals and Risk Factors at Admission - 09/18/16 1439      Core Components/Risk Factors/Patient Goals on Admission    Weight Management Weight Maintenance   Increase Strength and Stamina Yes   Intervention Provide advice, education, support and counseling about physical activity/exercise needs.;Develop an individualized exercise prescription for aerobic and resistive training based on initial evaluation findings, risk stratification, comorbidities and participant's personal goals.   Expected Outcomes Achievement of increased cardiorespiratory fitness and enhanced flexibility, muscular endurance and strength shown through measurements of functional capacity and personal statement of participant.   Diabetes Yes   Intervention Provide education about  signs/symptoms and action to take for hypo/hyperglycemia.;Provide education about proper nutrition, including hydration, and aerobic/resistive exercise prescription along with prescribed medications to achieve blood glucose in normal ranges: Fasting glucose 65-99 mg/dL   Expected Outcomes Long Term: Attainment of HbA1C < 7%.   Lipids Yes   Intervention Provide education and support for participant on nutrition & aerobic/resistive exercise along with prescribed medications to achieve LDL <25m, HDL >474m   Expected Outcomes Short Term: Participant states understanding of desired cholesterol values and is compliant with medications prescribed. Participant is following exercise prescription and nutrition guidelines.;Long Term: Cholesterol controlled with medications as prescribed, with individualized exercise RX and with personalized nutrition plan. Value goals: LDL < 7065mHDL > 40 mg.   Personal Goal Other Yes   Personal Goal Learn more about diet and nutrition. Get on an exercise routine and live a long life.    Intervention Attend cardiac rehab sessions 3 days/week and supplement  with exercise at home 2 days/week.   Expected Outcomes Patient will complete the program meeting the above stated goals.       Core Components/Risk Factors/Patient Goals Review:      Goals and Risk Factor Review    Row Name 09/18/16 1443 12/09/16 1241 12/31/16 0825         Core Components/Risk Factors/Patient Goals Review   Personal Goals Review Weight Management/Obesity;Increase Strength and Stamina;Lipids;Diabetes Weight Management/Obesity  Learn more about diet and nutrition. Get on an exercise routine and live a long life.  Weight Management/Obesity;Increase Strength and Stamina  Learn more about nutrition and get on an exercise program.      Review  - Patient has attended 30 sessions. Patient has maintained her weight. She is progressing well in the program with increased strength and stamina. Upon graduation,  patient lost 1.9 lbs. Her strength and stamina did increase. She says she feels better after completing the program and plans to continue to exercise. Her Medficts score improved.     Expected Outcomes  - Patient will complete the program meeting her personal goals.  Patient will continue to exercise maintaining her weight with increased strength and stamina.         Core Components/Risk Factors/Patient Goals at Discharge (Final Review):      Goals and Risk Factor Review - 12/31/16 0825      Core Components/Risk Factors/Patient Goals Review   Personal Goals Review Weight Management/Obesity;Increase Strength and Stamina  Learn more about nutrition and get on an exercise program.    Review Upon graduation, patient lost 1.9 lbs. Her strength and stamina did increase. She says she feels better after completing the program and plans to continue to exercise. Her Medficts score improved.   Expected Outcomes Patient will continue to exercise maintaining her weight with increased strength and stamina.       ITP Comments:   Comments: Patient graduated from [place] today on 12/25/16 after completing 36 sessions. She achieved LTG of 30 minutes of aerobic exercise at Max Met level of 4.1. All patients vitals are WNL. Patient has met with dietician. Discharge instruction has been reviewed in detail and patient stated an understanding of material given. Patient plans to continue exercising at home. Cardiac Rehab staff will make f/u calls at 1 month, 6 months, and 1 year. Patient had no complaints of any abnormal S/S or pain on their exit visit.

## 2016-12-31 NOTE — Progress Notes (Deleted)
Discharge Summary  Patient Details  Name: Rhonda Allen MRN: [id number] Date of Birth: [birth year] Referring Provider:   Flowsheet Row CARDIAC REHAB PHASE II ORIENTATION from 09/18/2016 in [place]  Referring Provider  Dr. Domenic Polite       Number of Visits: 36  Reason for Discharge:  Patient reached a stable level of exercise. Patient independent in their exercise.  Smoking History:  History  Smoking Status  . Never Smoker  Smokeless Tobacco  . Never Used    Diagnosis:  NSTEMI (non-ST elevated myocardial infarction) ([place])  ADL [facility]:   Initial Exercise Prescription:     Initial Exercise Prescription - 09/18/16 1400      Date of Initial Exercise RX and Referring Provider   Date 09/18/16   Referring Provider Dr. Domenic Polite     Treadmill   MPH 1.3   Grade 0   Minutes 15   METs 1.9     NuStep   Level 2   Watts 15   Minutes 20   METs 1.9     Prescription Details   Frequency (times per week) 3   Duration Progress to 30 minutes of continuous aerobic without signs/symptoms of physical distress     Intensity   THRR REST +  30   THRR 40-80% of Max Heartrate [id number]   Ratings of Perceived Exertion 11-13   Perceived Dyspnea 0-4     Progression   Progression Continue progressive overload as per policy without signs/symptoms or physical distress.     Resistance Training   Training Prescription Yes   Weight 1   Reps 10-12      Discharge Exercise Prescription (Final Exercise Prescription Changes):     Exercise Prescription Changes - 12/03/16 1200      Exercise Review   Progression Yes     Response to Exercise   Blood Pressure (Admit) 90/48   Blood Pressure (Exercise) 110/50   Blood Pressure (Exit) 90/50   Heart Rate (Admit) 93 bpm   Heart Rate (Exercise) 116 bpm   Heart Rate (Exit) 102 bpm   Rating of Perceived Exertion (Exercise) 11   Duration Progress to 30 minutes of continuous aerobic without signs/symptoms of  physical distress   Intensity Rest + 30     Progression   Progression Continue progressive overload as per policy without signs/symptoms or physical distress.     Resistance Training   Training Prescription Yes   Weight 5   Reps 10-12     Treadmill   MPH 3.3   Grade 1   Minutes 15   METs 4     NuStep   Level 4   Watts 37   Minutes 20   METs 3.45     Home Exercise Plan   Plans to continue exercise at Home   Frequency Add 2 additional days to program exercise sessions.      Functional Capacity:     6 Minute Walk    Row Name 09/18/16 1419 12/28/16 0912       6 Minute Walk   Phase Initial Discharge    Distance 1650 feet 2000 feet    Distance % Change  - 21.21 %    Walk Time 6 minutes 6 minutes    # of Rest Breaks 0 0    MPH 3.12 3.12    METS 3.39 3.78    RPE 10 11    Perceived Dyspnea  10 11    VO2 Peak 16.09 19.11  Symptoms No No    Resting HR 68 bpm 81 bpm    Resting BP 120/58 120/58    Max Ex. HR 110 bpm 127 bpm    Max Ex. BP 154/76 160/70    2 Minute Post BP 132/72 128/64       Psychological, QOL, Others - Outcomes: PHQ 2/9: Depression screen [facility] 2/9 12/31/2016 09/18/2016  Decreased Interest 0 0  Down, Depressed, Hopeless 0 0  PHQ - 2 Score 0 0  Altered sleeping 1 1  Tired, decreased energy 0 0  Change in appetite 0 1  Feeling bad or failure about yourself  0 0  Trouble concentrating 0 0  Moving slowly or fidgety/restless 0 0  Suicidal thoughts 0 0  PHQ-9 Score 1 2  Difficult doing work/chores Not difficult at all Not difficult at all    Quality of Life:     Quality of Life - 12/28/16 0917      Quality of Life Scores   Health/Function Pre 26.6 %   Health/Function Post 24.6 %   Health/Function % Change -7.52 %   Socioeconomic Pre 27.06 %   Socioeconomic Post 25.71 %   Socioeconomic % Change  -4.99 %   Psych/Spiritual Pre 26.21 %   Psych/Spiritual Post 24 %   Psych/Spiritual % Change -8.43 %   Family Pre 28.5 %   Family Post 27.6 %    Family % Change -3.16 %   GLOBAL Pre 26.9 %   GLOBAL Post 25.16 %   GLOBAL % Change -6.47 %      Personal Goals: Goals established at orientation with interventions provided to work toward goal.     Personal Goals and Risk Factors at Admission - 09/18/16 1439      Core Components/Risk Factors/Patient Goals on Admission    Weight Management Weight Maintenance   Increase Strength and Stamina Yes   Intervention Provide advice, education, support and counseling about physical activity/exercise needs.;Develop an individualized exercise prescription for aerobic and resistive training based on initial evaluation findings, risk stratification, comorbidities and participant's personal goals.   Expected Outcomes Achievement of increased cardiorespiratory fitness and enhanced flexibility, muscular endurance and strength shown through measurements of functional capacity and personal statement of participant.   Diabetes Yes   Intervention Provide education about signs/symptoms and action to take for hypo/hyperglycemia.;Provide education about proper nutrition, including hydration, and aerobic/resistive exercise prescription along with prescribed medications to achieve blood glucose in normal ranges: Fasting glucose 65-99 mg/dL   Expected Outcomes Long Term: Attainment of HbA1C < 7%.   Lipids Yes   Intervention Provide education and support for participant on nutrition & aerobic/resistive exercise along with prescribed medications to achieve LDL 70mg , HDL >40mg .   Expected Outcomes Short Term: Participant states understanding of desired cholesterol values and is compliant with medications prescribed. Participant is following exercise prescription and nutrition guidelines.;Long Term: Cholesterol controlled with medications as prescribed, with individualized exercise RX and with personalized nutrition plan. Value goals: LDL < 70mg , HDL > 40 mg.   Personal Goal Other Yes   Personal Goal Learn more about  diet and nutrition. Get on an exercise routine and live a long life.    Intervention Attend cardiac rehab sessions 3 days/week and supplement with exercise at home 2 days/week.   Expected Outcomes Patient will complete the program meeting the above stated goals.        Personal Goals Discharge:     Goals and Risk Factor Review    Row Name  09/18/16 1443 12/09/16 1241 12/31/16 0825         Core Components/Risk Factors/Patient Goals Review   Personal Goals Review Weight Management/Obesity;Increase Strength and Stamina;Lipids;Diabetes Weight Management/Obesity  Learn more about diet and nutrition. Get on an exercise routine and live a long life.  Weight Management/Obesity;Increase Strength and Stamina  Learn more about nutrition and get on an exercise program.      Review  - Patient has attended 30 sessions. Patient has maintained her weight. She is progressing well in the program with increased strength and stamina. Upon graduation, patient lost 1.9 lbs. Her strength and stamina did increase. She says she feels better after completing the program and plans to continue to exercise. Her Medficts score improved.     Expected Outcomes  - Patient will complete the program meeting her personal goals.  Patient will continue to exercise maintaining her weight with increased strength and stamina.         Nutrition & Weight - Outcomes:     Pre Biometrics - 09/18/16 1422      Pre Biometrics   Waist Circumference 32 inches   Hip Circumference 39.5 inches   Waist to Hip Ratio 0.81 %   BMI (Calculated) 23.5   Triceps Skinfold 18 mm   % Body Fat 32.3 %   Grip Strength 78 kg   Flexibility 14.16 in   Single Leg Stand 30 seconds         Post Biometrics - 12/28/16 0916       Post  Biometrics   Height 5\' 8"  (1.727 m)   Weight 154 lb 5.2 oz (70 kg)   Waist Circumference 30.5 inches   Hip Circumference 37 inches   Waist to Hip Ratio 0.82 %   BMI (Calculated) 23.5   Triceps Skinfold 18 mm    % Body Fat 31.8 %   Grip Strength 67.3 kg   Flexibility 16.83 in   Single Leg Stand 60 seconds      Nutrition:   Nutrition Discharge:     Nutrition Assessments - 12/31/16 0825      MEDFICTS Scores   Pre Score 21   Post Score 9   Score Difference -12      Education Questionnaire Score:     Knowledge Questionnaire Score - 12/31/16 0824      Knowledge Questionnaire Score   Pre Score 26/28   Post Score 24/24      Goals reviewed with patient; copy given to patient.

## 2016-12-31 NOTE — Progress Notes (Deleted)
Cardiac Individual Treatment Plan  Patient Details  Name: Rhonda Allen MRN: [id number] Date of Birth: [birth year] Referring Provider:   Flowsheet Row CARDIAC REHAB PHASE II ORIENTATION from 09/18/2016 in [place]  Referring Provider  Dr. Domenic Polite      Initial Encounter Date:  Flowsheet Row CARDIAC REHAB PHASE II ORIENTATION from 09/18/2016 in [place]  Date  09/18/16  Referring Provider  Dr. Domenic Polite      Visit Diagnosis: NSTEMI (non-ST elevated myocardial infarction) [facility])  Patient's Home Medications on Admission:  Current Outpatient Prescriptions:  .  ACCU-CHEK AVIVA PLUS test strip, , Disp: , Rfl:  .  aspirin 81 MG tablet, Take 81 mg by mouth daily.  , Disp: , Rfl:  .  atorvastatin (LIPITOR) 80 MG tablet, Take 1 tablet (80 mg total) by mouth daily at 6 PM., Disp: 30 tablet, Rfl: 5 .  B-D ULTRAFINE III SHORT PEN 31G X 8 MM MISC, , Disp: , Rfl:  .  BD INSULIN SYRINGE ULTRAFINE 31G X 5/16" 0.3 ML MISC, , Disp: , Rfl:  .  Ferrous Sulfate (IRON) 325 (65 FE) MG TABS, Take 1 tablet by mouth every Monday, Wednesday, and Friday. , Disp: , Rfl:  .  insulin glargine (LANTUS) 100 UNIT/ML injection, Inject 18-19 Units into the skin at bedtime. , Disp: , Rfl:  .  insulin lispro (HUMALOG) 100 UNIT/ML injection, Inject 8-15 Units into the skin 3 (three) times daily before meals. Per sliding scale, Disp: , Rfl:  .  lisinopril (PRINIVIL,ZESTRIL) 10 MG tablet, Take 10 mg by mouth daily.  , Disp: , Rfl:  .  metoprolol tartrate (LOPRESSOR) 25 MG tablet, Take 0.5 tablets (12.5 mg total) by mouth 2 (two) times daily., Disp: 60 tablet, Rfl: 5 .  Multiple Vitamin (MULTIVITAMIN) tablet, Take 1 tablet by mouth every Monday, Wednesday, and Friday. , Disp: , Rfl:  .  nitroGLYCERIN (NITROSTAT) 0.4 MG SL tablet, Place 1 tablet (0.4 mg total) under the tongue every 5 (five) minutes as needed for chest pain., Disp: 25 tablet, Rfl: 2 .  Omega-3 Fatty Acids  (FISH OIL PO), Take 1 capsule by mouth See admin instructions. Once to twice daily, Disp: , Rfl:  .  omeprazole (PRILOSEC) 40 MG capsule, TAKE ONE CAPSULE BY MOUTH DAILY, Disp: 30 capsule, Rfl: 2 .  polycarbophil (FIBERCON) 625 MG tablet, Take 625 mg by mouth daily., Disp: , Rfl:  .  ticagrelor (BRILINTA) 90 MG TABS tablet, Take 1 tablet (90 mg total) by mouth 2 (two) times daily., Disp: 60 tablet, Rfl: 10  Past Medical History: Past Medical History:  Diagnosis Date  . Anemia   . Diabetes mellitus   . Diverticulosis of colon   . Frozen shoulder    right  . GERD (gastroesophageal reflux disease)   . Hemorrhoids   . Hypertension     Tobacco Use: History  Smoking Status  . Never Smoker  Smokeless Tobacco  . Never Used    Labs: Recent Review Flowsheet Data    Labs for ITP Cardiac and Pulmonary Rehab Latest Ref Rng & Units 08/15/2016   Cholestrol 0 - 200 mg/dL 216(H)   LDLCALC 0 - 99 mg/dL 128(H)   HDL >40 mg/dL 80   Trlycerides <150 mg/dL 40      Capillary Blood Glucose: Lab Results  Component Value Date   GLUCAP 79 12/23/2016   GLUCAP 40 (LL) 12/23/2016   GLUCAP 29 (LL) 12/23/2016   GLUCAP 285 (H) 08/18/2016  GLUCAP 131 (H) 08/18/2016     Exercise Target Goals:    Exercise Program Goal: Individual exercise prescription set with THRR, safety & activity barriers. Participant demonstrates ability to understand and report RPE using BORG scale, to self-measure pulse accurately, and to acknowledge the importance of the exercise prescription.  Exercise Prescription Goal: Starting with aerobic activity 30 plus minutes a day, 3 days per week for initial exercise prescription. Provide home exercise prescription and guidelines that participant acknowledges understanding prior to discharge.  Activity Barriers & Risk Stratification:     Activity Barriers & Cardiac Risk Stratification - 09/18/16 1435      Activity Barriers & Cardiac Risk Stratification   Activity Barriers  None   Cardiac Risk Stratification High      6 Minute Walk:     6 Minute Walk    Row Name 09/18/16 1419 12/28/16 0912       6 Minute Walk   Phase Initial Discharge    Distance 1650 feet 2000 feet    Distance % Change  - 21.21 %    Walk Time 6 minutes 6 minutes    # of Rest Breaks 0 0    MPH 3.12 3.12    METS 3.39 3.78    RPE 10 11    Perceived Dyspnea  10 11    VO2 Peak 16.09 19.11    Symptoms No No    Resting HR 68 bpm 81 bpm    Resting BP 120/58 120/58    Max Ex. HR 110 bpm 127 bpm    Max Ex. BP 154/76 160/70    2 Minute Post BP 132/72 128/64       Initial Exercise Prescription:     Initial Exercise Prescription - 09/18/16 1400      Date of Initial Exercise RX and Referring Provider   Date 09/18/16   Referring Provider Dr. Domenic Polite     Treadmill   MPH 1.3   Grade 0   Minutes 15   METs 1.9     NuStep   Level 2   Watts 15   Minutes 20   METs 1.9     Prescription Details   Frequency (times per week) 3   Duration Progress to 30 minutes of continuous aerobic without signs/symptoms of physical distress     Intensity   THRR REST +  30   THRR 40-80% of Max Heartrate 519 300 4360   Ratings of Perceived Exertion 11-13   Perceived Dyspnea 0-4     Progression   Progression Continue progressive overload as per policy without signs/symptoms or physical distress.     Resistance Training   Training Prescription Yes   Weight 1   Reps 10-12      Perform Capillary Blood Glucose checks as needed.  Exercise Prescription Changes:      Exercise Prescription Changes    Row Name 12/03/16 1200             Exercise Review   Progression Yes         Response to Exercise   Blood Pressure (Admit) 90/48       Blood Pressure (Exercise) 110/50       Blood Pressure (Exit) 90/50       Heart Rate (Admit) 93 bpm       Heart Rate (Exercise) 116 bpm       Heart Rate (Exit) 102 bpm       Rating of Perceived Exertion (Exercise) 11  Duration Progress to 30  minutes of continuous aerobic without signs/symptoms of physical distress       Intensity Rest + 30         Progression   Progression Continue progressive overload as per policy without signs/symptoms or physical distress.         Resistance Training   Training Prescription Yes       Weight 5       Reps 10-12         Treadmill   MPH 3.3       Grade 1       Minutes 15       METs 4         NuStep   Level 4       Watts 37       Minutes 20       METs 3.45         Home Exercise Plan   Plans to continue exercise at Home       Frequency Add 2 additional days to program exercise sessions.          Exercise Comments:      Exercise Comments    Row Name 12/03/16 1531           Exercise Comments Patient is progressing appropriately            Discharge Exercise Prescription (Final Exercise Prescription Changes):     Exercise Prescription Changes - 12/03/16 1200      Exercise Review   Progression Yes     Response to Exercise   Blood Pressure (Admit) 90/48   Blood Pressure (Exercise) 110/50   Blood Pressure (Exit) 90/50   Heart Rate (Admit) 93 bpm   Heart Rate (Exercise) 116 bpm   Heart Rate (Exit) 102 bpm   Rating of Perceived Exertion (Exercise) 11   Duration Progress to 30 minutes of continuous aerobic without signs/symptoms of physical distress   Intensity Rest + 30     Progression   Progression Continue progressive overload as per policy without signs/symptoms or physical distress.     Resistance Training   Training Prescription Yes   Weight 5   Reps 10-12     Treadmill   MPH 3.3   Grade 1   Minutes 15   METs 4     NuStep   Level 4   Watts 37   Minutes 20   METs 3.45     Home Exercise Plan   Plans to continue exercise at Home   Frequency Add 2 additional days to program exercise sessions.      Nutrition:  Target Goals: Understanding of nutrition guidelines, daily intake of sodium <1567m, cholesterol <2050m calories 30% from fat and 7%  or less from saturated fats, daily to have 5 or more servings of fruits and vegetables.  Biometrics:     Pre Biometrics - 09/18/16 1422      Pre Biometrics   Waist Circumference 32 inches   Hip Circumference 39.5 inches   Waist to Hip Ratio 0.81 %   BMI (Calculated) 23.5   Triceps Skinfold 18 mm   % Body Fat 32.3 %   Grip Strength 78 kg   Flexibility 14.16 in   Single Leg Stand 30 seconds         Post Biometrics - 12/28/16 0916       Post  Biometrics   Height '5\' 8"'  (1.727 m)   Weight 154 lb 5.2 oz (  70 kg)   Waist Circumference 30.5 inches   Hip Circumference 37 inches   Waist to Hip Ratio 0.82 %   BMI (Calculated) 23.5   Triceps Skinfold 18 mm   % Body Fat 31.8 %   Grip Strength 67.3 kg   Flexibility 16.83 in   Single Leg Stand 60 seconds      Nutrition Therapy Plan and Nutrition Goals:   Nutrition Discharge: Rate Your Plate Scores:     Nutrition Assessments - 12/31/16 0825      MEDFICTS Scores   Pre Score 21   Post Score 9   Score Difference -12      Nutrition Goals Re-Evaluation:   Psychosocial: Target Goals: Acknowledge presence or absence of depression, maximize coping skills, provide positive support system. Participant is able to verbalize types and ability to use techniques and skills needed for reducing stress and depression.  Initial Review & Psychosocial Screening:     Initial Psych Review & Screening - 09/18/16 [place]? Yes     Barriers   Psychosocial barriers to participate in program --  No psychosocial barriers identified.      Screening Interventions   Interventions Encouraged to exercise  Patient is not depressed and does not need counseling.       Quality of Life Scores:     Quality of Life - 12/28/16 0917      Quality of Life Scores   Health/Function Pre 26.6 %   Health/Function Post 24.6 %   Health/Function % Change -7.52 %   Socioeconomic Pre 27.06 %   Socioeconomic Post  25.71 %   Socioeconomic % Change  -4.99 %   Psych/Spiritual Pre 26.21 %   Psych/Spiritual Post 24 %   Psych/Spiritual % Change -8.43 %   Family Pre 28.5 %   Family Post 27.6 %   Family % Change -3.16 %   GLOBAL Pre 26.9 %   GLOBAL Post 25.16 %   GLOBAL % Change -6.47 %      PHQ-9: Recent Review Flowsheet Data    Depression screen [facility] 2/9 12/31/2016 09/18/2016   Decreased Interest 0 0   Down, Depressed, Hopeless 0 0   PHQ - 2 Score 0 0   Altered sleeping 1 1   Tired, decreased energy 0 0   Change in appetite 0 1   Feeling bad or failure about yourself  0 0   Trouble concentrating 0 0   Moving slowly or fidgety/restless 0 0   Suicidal thoughts 0 0   PHQ-9 Score 1 2   Difficult doing work/chores Not difficult at all Not difficult at all      Psychosocial Evaluation and Intervention:     Psychosocial Evaluation - 12/31/16 0829      Discharge Psychosocial Assessment & Intervention   Comments Patient has no psychosocial issues identified at discharge.       Psychosocial Re-Evaluation:     Psychosocial Re-Evaluation    [place] Name 12/09/16 1243             Psychosocial Re-Evaluation   Interventions Encouraged to attend Cardiac Rehabilitation for the exercise       Comments Patient's QOL score was 26.90 and her PHQ-9 score was 2. She continues to have no psychosoical issues identified.        Continued Psychosocial Services Needed No          Vocational Rehabilitation: Provide vocational rehab assistance  to qualifying candidates.   Vocational Rehab Evaluation & Intervention:     Vocational Rehab - 09/18/16 1438      Initial Vocational Rehab Evaluation & Intervention   Assessment shows need for Vocational Rehabilitation No      Education: Education Goals: Education classes will be provided on a weekly basis, covering required topics. Participant will state understanding/return demonstration of topics presented.  Learning Barriers/Preferences:      Learning Barriers/Preferences - 09/18/16 1436      Learning Barriers/Preferences   Learning Barriers None   Learning Preferences Verbal Instruction;Skilled Demonstration      Education Topics: Hypertension, Hypertension Reduction -Define heart disease and high blood pressure. Discus how high blood pressure affects the body and ways to reduce high blood pressure. Flowsheet Row CARDIAC REHAB PHASE II EXERCISE from 11/25/2016 in [place]  Date  10/28/16  Educator  DC  Instruction Review Code  2- meets goals/outcomes      Exercise and Your Heart -Discuss why it is important to exercise, the FITT principles of exercise, normal and abnormal responses to exercise, and how to exercise safely. Flowsheet Row CARDIAC REHAB PHASE II EXERCISE from 11/25/2016 in [place]  Date  11/04/16  Educator  Russella Dar  Instruction Review Code  2- meets goals/outcomes      Angina -Discuss definition of angina, causes of angina, treatment of angina, and how to decrease risk of having angina. Flowsheet Row CARDIAC REHAB PHASE II EXERCISE from 11/25/2016 in [place]  Date  11/11/16  Educator  Russella Dar  Instruction Review Code  2- meets goals/outcomes      Cardiac Medications -Review what the following cardiac medications are used for, how they affect the body, and side effects that may occur when taking the medications.  Medications include Aspirin, Beta blockers, calcium channel blockers, ACE Inhibitors, angiotensin receptor blockers, diuretics, digoxin, and antihyperlipidemics. Flowsheet Row CARDIAC REHAB PHASE II EXERCISE from 11/25/2016 in [place]  Date  11/18/16  Educator  Nils Flack  Instruction Review Code  2- meets goals/outcomes      Congestive Heart Failure -Discuss the definition of CHF, how to live with CHF, the signs and symptoms of CHF, and how keep track of weight and sodium  intake. Flowsheet Row CARDIAC REHAB PHASE II EXERCISE from 11/25/2016 in [place]  Date  11/25/16  Educator  Lisabeth Register  Instruction Review Code  2- meets goals/outcomes      Heart Disease and Intimacy -Discus the effect sexual activity has on the heart, how changes occur during intimacy as we age, and safety during sexual activity.   Smoking Cessation / COPD -Discuss different methods to quit smoking, the health benefits of quitting smoking, and the definition of COPD.   Nutrition I: Fats -Discuss the types of cholesterol, what cholesterol does to the heart, and how cholesterol levels can be controlled.   Nutrition II: Labels -Discuss the different components of food labels and how to read food label [place] from 11/25/2016 in [place]  Date  09/23/16  Educator  Russella Dar  Instruction Review Code  2- meets goals/outcomes      Heart Parts and Heart Disease -Discuss the anatomy of the heart, the pathway of blood circulation through the heart, and these are affected by heart disease. Flowsheet Row CARDIAC REHAB PHASE II EXERCISE from 11/25/2016 in [place]  Date  09/30/16  Educator  DC  Instruction Review Code  2- meets goals/outcomes      Stress I: Signs and Symptoms -Discuss the causes of stress, how stress may lead to anxiety and depression, and ways to limit stress. Flowsheet Row CARDIAC REHAB PHASE II EXERCISE from 11/25/2016 in [place]  Date  10/07/16  Educator  Russella Dar  Instruction Review Code  2- meets goals/outcomes      Stress II: Relaxation -Discuss different types of relaxation techniques to limit stress. Flowsheet Row CARDIAC REHAB PHASE II EXERCISE from 11/25/2016 in [place]  Date  10/14/16  Educator  Russella Dar  Instruction Review Code  2- meets goals/outcomes      Warning Signs  of Stroke / TIA -Discuss definition of a stroke, what the signs and symptoms are of a stroke, and how to identify when someone is having stroke. Flowsheet Row CARDIAC REHAB PHASE II EXERCISE from 11/25/2016 in [place]  Date  10/21/16  Educator  Russella Dar  Instruction Review Code  2- meets goals/outcomes      Knowledge Questionnaire Score:     Knowledge Questionnaire Score - 12/31/16 3235      Knowledge Questionnaire Score   Pre Score 26/28   Post Score 24/24      Core Components/Risk Factors/Patient Goals at Admission:     Personal Goals and Risk Factors at Admission - 09/18/16 1439      Core Components/Risk Factors/Patient Goals on Admission    Weight Management Weight Maintenance   Increase Strength and Stamina Yes   Intervention Provide advice, education, support and counseling about physical activity/exercise needs.;Develop an individualized exercise prescription for aerobic and resistive training based on initial evaluation findings, risk stratification, comorbidities and participant's personal goals.   Expected Outcomes Achievement of increased cardiorespiratory fitness and enhanced flexibility, muscular endurance and strength shown through measurements of functional capacity and personal statement of participant.   Diabetes Yes   Intervention Provide education about signs/symptoms and action to take for hypo/hyperglycemia.;Provide education about proper nutrition, including hydration, and aerobic/resistive exercise prescription along with prescribed medications to achieve blood glucose in normal ranges: Fasting glucose 65-99 mg/dL   Expected Outcomes Long Term: Attainment of HbA1C < 7%.   Lipids Yes   Intervention Provide education and support for participant on nutrition & aerobic/resistive exercise along with prescribed medications to achieve LDL <62m, HDL >435m   Expected Outcomes Short Term: Participant states understanding of desired  cholesterol values and is compliant with medications prescribed. Participant is following exercise prescription and nutrition guidelines.;Long Term: Cholesterol controlled with medications as prescribed, with individualized exercise RX and with personalized nutrition plan. Value goals: LDL < 7057mHDL > 40 mg.   Personal Goal Other Yes   Personal Goal Learn more about diet and nutrition. Get on an exercise routine and live a long life.    Intervention Attend cardiac rehab sessions 3 days/week and supplement with exercise at home 2 days/week.   Expected Outcomes Patient will complete the program meeting the above stated goals.       Core Components/Risk Factors/Patient Goals Review:      Goals and Risk Factor Review    Row Name 09/18/16 1443 12/09/16 1241 12/31/16 0825         Core Components/Risk Factors/Patient Goals Review   Personal Goals Review Weight Management/Obesity;Increase Strength and Stamina;Lipids;Diabetes Weight Management/Obesity  Learn more about diet and nutrition. Get on an exercise routine and live a long life.  Weight Management/Obesity;Increase  Strength and Stamina  Learn more about nutrition and get on an exercise program.      Review  - Patient has attended 30 sessions. Patient has maintained her weight. She is progressing well in the program with increased strength and stamina. Upon graduation, patient lost 1.9 lbs. Her strength and stamina did increase. She says she feels better after completing the program and plans to continue to exercise. Her Medficts score improved.     Expected Outcomes  - Patient will complete the program meeting her personal goals.  Patient will continue to exercise maintaining her weight with increased strength and stamina.         Core Components/Risk Factors/Patient Goals at Discharge (Final Review):      Goals and Risk Factor Review - 12/31/16 0825      Core Components/Risk Factors/Patient Goals Review   Personal Goals Review Weight  Management/Obesity;Increase Strength and Stamina  Learn more about nutrition and get on an exercise program.    Review Upon graduation, patient lost 1.9 lbs. Her strength and stamina did increase. She says she feels better after completing the program and plans to continue to exercise. Her Medficts score improved.   Expected Outcomes Patient will continue to exercise maintaining her weight with increased strength and stamina.       ITP Comments:   Comments: Patient graduated from [facility] today on 12/25/16 after completing 36 sessions. She achieved LTG of 30 minutes of aerobic exercise at Max Met level of 4.1. All patients vitals are WNL. Patient has met with dietician. Discharge instruction has been reviewed in detail and patient stated an understanding of material given. Patient plans to continue exercising at home. Cardiac Rehab staff will make f/u calls at 1 month, 6 months, and 1 year. Patient had no complaints of any abnormal S/S or pain on their exit visit.

## 2016-12-31 NOTE — Addendum Note (Signed)
Encounter addended by: Dwana Melena, RN on: 12/31/2016  8:36 AM<BR>    Actions taken: Visit Navigator Flowsheet section accepted, Sign clinical note, Delete clinical note

## 2017-01-01 ENCOUNTER — Encounter (HOSPITAL_COMMUNITY): Payer: 59

## 2017-01-01 ENCOUNTER — Telehealth: Payer: Self-pay

## 2017-01-01 DIAGNOSIS — E7800 Pure hypercholesterolemia, unspecified: Secondary | ICD-10-CM

## 2017-01-01 NOTE — Telephone Encounter (Signed)
LM for pt to call back,placed order for lipids

## 2017-01-01 NOTE — Telephone Encounter (Signed)
-----   Message from Satira Sark, MD sent at 01/01/2017 12:38 PM EST ----- Regarding: RE: Orion-10 Trial Although I am listed as her primary cardiologist, I have not actually seen her in the office. I rounded on her a few days when she was in the hospital under the care of Dr. Wynonia Lawman around the time of her surgery. It looks like she has not had a follow-up lipid panel since August 2017, so this should be checked first to see what her LDL is now on high-dose Lipitor before considering enrollment in a trial. We will get this arranged through nursing.  ----- Message ----- From: Tempie Donning, RN Sent: 01/01/2017  12:14 PM To: Beola Cord, RN, Satira Sark, MD Subject: [facility] Fam Trial                                 Good morning,  I am writing to inquire if you feel that Ms. Wyeth would be a good candidate for the Orion-10 Trial. Her LDL's on 08/15/16 were 128 and she was started on Lipitor 80mg  on 08/18/16.Please advise if you feel she would be a good candidate for this trial.  Thank You, Ambrose Pancoast, RN, MSN  The Orion-10 trial is a placebo-controlled, double-blind,randomized trial to evaluate the effects of 300 mg subcutaneous injection of Inclisiran sodium in subjects with ASCVD and elevated LDL cholesterol.

## 2017-01-04 ENCOUNTER — Encounter (HOSPITAL_COMMUNITY): Payer: 59

## 2017-01-07 NOTE — Telephone Encounter (Signed)
Pt returned my call, she says she is going to have lipids done by pcp and will drop them off here.I will then have them scanned and notify Ambrose Pancoast RN

## 2017-01-18 DIAGNOSIS — Z0000 Encounter for general adult medical examination without abnormal findings: Secondary | ICD-10-CM | POA: Diagnosis not present

## 2017-01-18 NOTE — Telephone Encounter (Signed)
LM with pt,I have not received any labs (lipids)  from her pcp and was calling for update.

## 2017-01-19 NOTE — Telephone Encounter (Signed)
Pt called back.Due to snow last week, she was unable to get labs drawn.Will have them done this Friday and fax to me on 01/25/17

## 2017-01-22 DIAGNOSIS — Z1389 Encounter for screening for other disorder: Secondary | ICD-10-CM | POA: Diagnosis not present

## 2017-01-22 DIAGNOSIS — Z794 Long term (current) use of insulin: Secondary | ICD-10-CM | POA: Diagnosis not present

## 2017-01-22 DIAGNOSIS — Z0000 Encounter for general adult medical examination without abnormal findings: Secondary | ICD-10-CM | POA: Diagnosis not present

## 2017-01-22 DIAGNOSIS — Z23 Encounter for immunization: Secondary | ICD-10-CM | POA: Diagnosis not present

## 2017-01-22 DIAGNOSIS — E784 Other hyperlipidemia: Secondary | ICD-10-CM | POA: Diagnosis not present

## 2017-01-22 DIAGNOSIS — E109 Type 1 diabetes mellitus without complications: Secondary | ICD-10-CM | POA: Diagnosis not present

## 2017-01-28 DIAGNOSIS — R8299 Other abnormal findings in urine: Secondary | ICD-10-CM | POA: Diagnosis not present

## 2017-02-03 DIAGNOSIS — Z1212 Encounter for screening for malignant neoplasm of rectum: Secondary | ICD-10-CM | POA: Diagnosis not present

## 2017-05-10 DIAGNOSIS — D225 Melanocytic nevi of trunk: Secondary | ICD-10-CM | POA: Diagnosis not present

## 2017-05-10 DIAGNOSIS — L820 Inflamed seborrheic keratosis: Secondary | ICD-10-CM | POA: Diagnosis not present

## 2017-05-10 DIAGNOSIS — L821 Other seborrheic keratosis: Secondary | ICD-10-CM | POA: Diagnosis not present

## 2017-05-28 DIAGNOSIS — R195 Other fecal abnormalities: Secondary | ICD-10-CM | POA: Diagnosis not present

## 2017-05-28 DIAGNOSIS — I110 Hypertensive heart disease with heart failure: Secondary | ICD-10-CM | POA: Diagnosis not present

## 2017-05-28 DIAGNOSIS — Z794 Long term (current) use of insulin: Secondary | ICD-10-CM | POA: Diagnosis not present

## 2017-06-24 ENCOUNTER — Encounter: Payer: Self-pay | Admitting: Cardiology

## 2017-06-24 NOTE — Progress Notes (Signed)
Cardiology Office Note  Date: 06/28/2017   ID: Rhonda, Allen [birth year], MRN [id number]  PCP: Rhonda Baton, MD  Evaluating Cardiologist: Rozann Lesches, MD   Chief Complaint  Patient presents with  . Coronary Artery Disease    History of Present Illness: Rhonda Allen is a 61 y.o. female that I am seeing for the first time in the office today. She was last seen by Ms. Lawrence DNP in December 2017. I reviewed her records and updated the chart. She presents today without complaints of angina or unusual shortness of breath. She has not used nitroglycerin. She walks with a friend on a local trail 3 or 4 times a week for exercise.  Cardiac history includes NSTEMI in August of last year with subsequent placement of DES to the proximal LAD. LVEF was 40-45% at that point.  We went over her medications which are outlined below. She does have easy bruising on Brilinta, will be able to stop this medication after August and stay on aspirin alone. Follow-up lipids from January looked much better on Lipitor. LDL 67.  Past Medical History:  Diagnosis Date  . Anemia   . CAD (coronary artery disease)    DES to proximal LAD August 2017  . Diverticulosis of colon   . Essential hypertension   . Frozen shoulder    Right  . GERD (gastroesophageal reflux disease)   . Hemorrhoids   . NSTEMI (non-ST elevated myocardial infarction) ([place]) 2017  . Type 2 diabetes mellitus ([place])     Past Surgical History:  Procedure Laterality Date  . CARDIAC CATHETERIZATION N/A 08/17/2016   Procedure: Left Heart Cath and Coronary Angiography;  Surgeon: Nelva Bush, MD;  Location: [facility];  Service: Cardiovascular;  Laterality: N/A;  . CARDIAC CATHETERIZATION N/A 08/17/2016   Procedure: Coronary Stent Intervention;  Surgeon: Nelva Bush, MD;  Location: [facility];  Service: Cardiovascular;  Laterality: N/A;  . CARDIAC CATHETERIZATION N/A 08/17/2016   Procedure: Intravascular  Ultrasound/IVUS;  Surgeon: Nelva Bush, MD;  Location: [facility];  Service: Cardiovascular;  Laterality: N/A;  . DILATION AND CURETTAGE OF UTERUS    . SHOULDER SURGERY     bone spur left shoulder  . WISDOM TOOTH EXTRACTION      Current Outpatient Prescriptions  Medication Sig Dispense Refill  . ACCU-CHEK AVIVA PLUS test strip     . aspirin 81 MG tablet Take 81 mg by mouth daily.      Marland Kitchen atorvastatin (LIPITOR) 80 MG tablet Take 1 tablet (80 mg total) by mouth daily at 6 PM. 30 tablet 5  . B-D ULTRAFINE III SHORT PEN 31G X 8 MM MISC     . BD INSULIN SYRINGE ULTRAFINE 31G X 5/16" 0.3 ML MISC     . Ferrous Sulfate (IRON) 325 (65 FE) MG TABS Take 1 tablet by mouth every Monday, Wednesday, and Friday.     . insulin glargine (LANTUS) 100 UNIT/ML injection Inject 18-19 Units into the skin at bedtime.     . insulin lispro (HUMALOG) 100 UNIT/ML injection Inject 8-15 Units into the skin 3 (three) times daily before meals. Per sliding scale    . lisinopril (PRINIVIL,ZESTRIL) 10 MG tablet Take 10 mg by mouth daily.      . metoprolol tartrate (LOPRESSOR) 25 MG tablet Take 0.5 tablets (12.5 mg total) by mouth 2 (two) times daily. 60 tablet 5  . Multiple Vitamin (MULTIVITAMIN) tablet Take 1 tablet by mouth every Monday, Wednesday,  and Friday.     . nitroGLYCERIN (NITROSTAT) 0.4 MG SL tablet Place 1 tablet (0.4 mg total) under the tongue every 5 (five) minutes as needed for chest pain. 25 tablet 2  . Omega-3 Fatty Acids (FISH OIL PO) Take 1 capsule by mouth See admin instructions. Once to twice daily    . omeprazole (PRILOSEC) 40 MG capsule TAKE ONE CAPSULE BY MOUTH DAILY 30 capsule 2  . polycarbophil (FIBERCON) 625 MG tablet Take 625 mg by mouth daily.    . ticagrelor (BRILINTA) 90 MG TABS tablet Take 1 tablet (90 mg total) by mouth 2 (two) times daily. (Patient taking differently: Take 90 mg by mouth 2 (two) times daily. AFTER THE MONTH OF AUGUST STOP BRILINTA) 60 tablet 10   No current  facility-administered medications for this visit.    Allergies:  Nickel   Social History: The patient  reports that she has never smoked. She has never used smokeless tobacco. She reports that she does not drink alcohol or use drugs.   ROS:  Please see the history of present illness. Otherwise, complete review of systems is positive for none.  All other systems are reviewed and negative.   Physical Exam: VS:  BP 128/78   Pulse 61   Ht 5\' 8"  (1.727 m)   Wt 153 lb (69.4 kg)   SpO2 98%   BMI 23.26 kg/m , BMI Body mass index is 23.26 kg/m.  Wt Readings from Last 3 Encounters:  06/28/17 153 lb (69.4 kg)  12/28/16 154 lb 5.2 oz (70 kg)  12/24/16 154 lb (69.9 kg)    General: Patient appears comfortable at rest. HEENT: Conjunctiva and lids normal, oropharynx clear. Neck: Supple, no elevated JVP or carotid bruits, no thyromegaly. Lungs: Clear to auscultation, nonlabored breathing at rest. Cardiac: Regular rate and rhythm, no S3 or significant systolic murmur, no pericardial rub. Abdomen: Soft, nontender, bowel sounds present. Extremities: No pitting edema, distal pulses 2+. Skin: Warm and dry. Musculoskeletal: No kyphosis. Neuropsychiatric: Alert and oriented x3, affect grossly appropriate.  ECG: I personally reviewed the tracing from 08/18/2016 which showed sinus rhythm with vertical axis and old anterior infarct pattern.  Recent Labwork: 08/14/2016: ALT 23; AST 31 08/18/2016: BUN 11; Creatinine, Ser 0.86; Hemoglobin 12.0; Platelets 158; Potassium 3.8; Sodium 136     Component Value Date/Time   CHOL 216 (H) 08/15/2016 0433   TRIG 40 08/15/2016 0433   HDL 80 08/15/2016 0433   CHOLHDL 2.7 08/15/2016 0433   VLDL 8 08/15/2016 0433   LDLCALC 128 (H) 08/15/2016 0433  January 2018: BUN 21, creatinine 0.9, potassium 4.6, AST 20, ALT 27, TSH 1.26, cholesterol 143, triglycerides 44, HDL 57, LDL 67, hemoglobin A1c 7.9  Other Studies Reviewed Today:  Cardiac catheterization and PCI  08/17/2016: 1. Severe single-vessel coronary artery disease with 99% stenosis of the proximal LAD. 2. Successful IVUS-guided PCI to the proximal LAD with placement of an Integrity Resolute 3.5 x 15 mm drug-eluting stent with 0% residual stenosis and TIMI-3 flow. 3. Upper normal left ventricular filling pressure.  Echocardiogram 08/15/2016: Study Conclusions  - Left ventricle: The cavity size was normal. Wall thickness was   normal. Systolic function was mildly to moderately reduced. The   estimated ejection fraction was in the range of 40% to 45%. There   is akinesis of the mid-apicalanteroseptal, anterolateral, and   apical myocardium. - Pulmonary arteries: Systolic pressure was mildly to moderately   increased. PA peak pressure: 42 mm Hg (S).  Assessment and Plan:  1. CAD status post NSTEMI in August 2017 with placement of DES to the proximal LAD. She is symptomatically stable on medical therapy, has not required nitroglycerin. I recommended continued walking regimen for exercise. She will be able to stop Brilinta after August.  2. Ischemic cardiomyopathy, LVEF 40-45% as of August 2017. We discussed obtaining a follow-up echocardiogram.  3. Hyperlipidemia, tolerating high-dose Lipitor. Recent HDL 57 and LDL 67.  4. Essential hypertension, blood pressure is adequately controlled today.  Current medicines were reviewed with the patient today.   Orders Placed This Encounter  Procedures  . ECHOCARDIOGRAM COMPLETE    Disposition: Follow-up in 6 months.  Signed, Satira Sark, MD, [facility] 06/28/2017 9:39 AM    [facility] [place], [place], [place] [address] Phone: [phone]; Fax: [phone]

## 2017-06-25 ENCOUNTER — Ambulatory Visit: Payer: 59 | Admitting: Cardiology

## 2017-06-28 ENCOUNTER — Encounter: Payer: Self-pay | Admitting: Cardiology

## 2017-06-28 ENCOUNTER — Ambulatory Visit (INDEPENDENT_AMBULATORY_CARE_PROVIDER_SITE_OTHER): Payer: 59 | Admitting: Cardiology

## 2017-06-28 VITALS — BP 128/78 | HR 61 | Ht 68.0 in | Wt 153.0 lb

## 2017-06-28 DIAGNOSIS — I255 Ischemic cardiomyopathy: Secondary | ICD-10-CM | POA: Diagnosis not present

## 2017-06-28 DIAGNOSIS — I2510 Atherosclerotic heart disease of native coronary artery without angina pectoris: Secondary | ICD-10-CM

## 2017-06-28 DIAGNOSIS — E782 Mixed hyperlipidemia: Secondary | ICD-10-CM

## 2017-06-28 DIAGNOSIS — I10 Essential (primary) hypertension: Secondary | ICD-10-CM

## 2017-06-28 NOTE — Patient Instructions (Addendum)
Medication Instructions:  Your physician has recommended you make the following change in your medication:  You may stop your Brilinta after the month of August Please continue all other medications as prescribed  Labwork: NONE  Testing/Procedures: Your physician has requested that you have an echocardiogram. Echocardiography is a painless test that uses sound waves to create images of your heart. It provides your doctor with information about the size and shape of your heart and how well your heart's chambers and valves are working. This procedure takes approximately one hour. There are no restrictions for this procedure.  Follow-Up: Your physician wants you to follow-up in: [place]. [place]. You will receive a reminder letter in the mail two months in advance. If you don't receive a letter, please call our office to schedule the follow-up appointment.  Any Other Special Instructions Will Be Listed Below (If Applicable).  If you need a refill on your cardiac medications before your next appointment, please call your pharmacy.

## 2017-07-02 DIAGNOSIS — E109 Type 1 diabetes mellitus without complications: Secondary | ICD-10-CM | POA: Diagnosis not present

## 2017-07-02 DIAGNOSIS — H40033 Anatomical narrow angle, bilateral: Secondary | ICD-10-CM | POA: Diagnosis not present

## 2017-07-02 DIAGNOSIS — H40013 Open angle with borderline findings, low risk, bilateral: Secondary | ICD-10-CM | POA: Diagnosis not present

## 2017-07-05 DIAGNOSIS — Z1231 Encounter for screening mammogram for malignant neoplasm of breast: Secondary | ICD-10-CM | POA: Diagnosis not present

## 2017-07-14 ENCOUNTER — Ambulatory Visit (HOSPITAL_COMMUNITY)
Admission: RE | Admit: 2017-07-14 | Discharge: 2017-07-14 | Disposition: A | Discharge status: Home / Self Care | Payer: 59 | Source: Ambulatory Visit | Attending: Cardiology | Admitting: Cardiology

## 2017-07-14 DIAGNOSIS — I10 Essential (primary) hypertension: Secondary | ICD-10-CM | POA: Insufficient documentation

## 2017-07-14 DIAGNOSIS — E119 Type 2 diabetes mellitus without complications: Secondary | ICD-10-CM | POA: Diagnosis not present

## 2017-07-14 DIAGNOSIS — I255 Ischemic cardiomyopathy: Secondary | ICD-10-CM

## 2017-07-14 DIAGNOSIS — I252 Old myocardial infarction: Secondary | ICD-10-CM | POA: Insufficient documentation

## 2017-07-14 DIAGNOSIS — I082 Rheumatic disorders of both aortic and tricuspid valves: Secondary | ICD-10-CM | POA: Insufficient documentation

## 2017-07-14 NOTE — Progress Notes (Signed)
*  PRELIMINARY RESULTS* Echocardiogram 2D Echocardiogram has been performed.  Leavy Cella 07/14/2017, 9:35 AM

## 2017-07-15 ENCOUNTER — Telehealth: Payer: Self-pay

## 2017-07-15 NOTE — Telephone Encounter (Signed)
Patient notified. Routed to PCP 

## 2017-07-15 NOTE — Telephone Encounter (Signed)
-----   Message from Acquanetta Chain, LPN sent at [phone]/[phone]  7:54 AM EDT -----   ----- Message ----- From: Satira Sark, MD Sent: 07/14/2017   4:50 PM To: Shon Baton, MD, Merlene Laughter, LPN  Results reviewed. LVEF has improved to normal range at 55% compared with study from last year. Continue medical therapy and scheduled follow-up. A copy of this test should be forwarded to Shon Baton, MD.

## 2017-07-26 DIAGNOSIS — M859 Disorder of bone density and structure, unspecified: Secondary | ICD-10-CM | POA: Diagnosis not present

## 2017-07-27 DIAGNOSIS — Z01419 Encounter for gynecological examination (general) (routine) without abnormal findings: Secondary | ICD-10-CM | POA: Diagnosis not present

## 2017-08-09 ENCOUNTER — Other Ambulatory Visit: Payer: Self-pay | Admitting: Cardiology

## 2017-09-13 ENCOUNTER — Other Ambulatory Visit: Payer: Self-pay | Admitting: Cardiology

## 2017-10-11 ENCOUNTER — Other Ambulatory Visit: Payer: Self-pay | Admitting: Cardiology

## 2017-10-11 NOTE — Telephone Encounter (Signed)
REFILL 

## 2017-12-07 DIAGNOSIS — Z794 Long term (current) use of insulin: Secondary | ICD-10-CM | POA: Diagnosis not present

## 2017-12-07 DIAGNOSIS — I110 Hypertensive heart disease with heart failure: Secondary | ICD-10-CM | POA: Diagnosis not present

## 2017-12-07 DIAGNOSIS — E109 Type 1 diabetes mellitus without complications: Secondary | ICD-10-CM | POA: Diagnosis not present

## 2018-01-20 DIAGNOSIS — Z0000 Encounter for general adult medical examination without abnormal findings: Secondary | ICD-10-CM | POA: Diagnosis not present

## 2018-01-20 DIAGNOSIS — R82998 Other abnormal findings in urine: Secondary | ICD-10-CM | POA: Diagnosis not present

## 2018-01-27 DIAGNOSIS — I110 Hypertensive heart disease with heart failure: Secondary | ICD-10-CM | POA: Diagnosis not present

## 2018-01-27 DIAGNOSIS — Z0000 Encounter for general adult medical examination without abnormal findings: Secondary | ICD-10-CM | POA: Diagnosis not present

## 2018-01-27 DIAGNOSIS — E109 Type 1 diabetes mellitus without complications: Secondary | ICD-10-CM | POA: Diagnosis not present

## 2018-01-27 DIAGNOSIS — Z794 Long term (current) use of insulin: Secondary | ICD-10-CM | POA: Diagnosis not present

## 2018-01-27 DIAGNOSIS — Z1389 Encounter for screening for other disorder: Secondary | ICD-10-CM | POA: Diagnosis not present

## 2018-01-31 DIAGNOSIS — Z1212 Encounter for screening for malignant neoplasm of rectum: Secondary | ICD-10-CM | POA: Diagnosis not present

## 2018-02-26 IMAGING — CR DG CHEST 2V
2 series · 2 of 2 positions shown · non-contrast
Comparison: None.

CLINICAL DATA: Initial evaluation for intermittent chest pain for 1
week.

EXAM:
CHEST  2 VIEW

[chest pa]
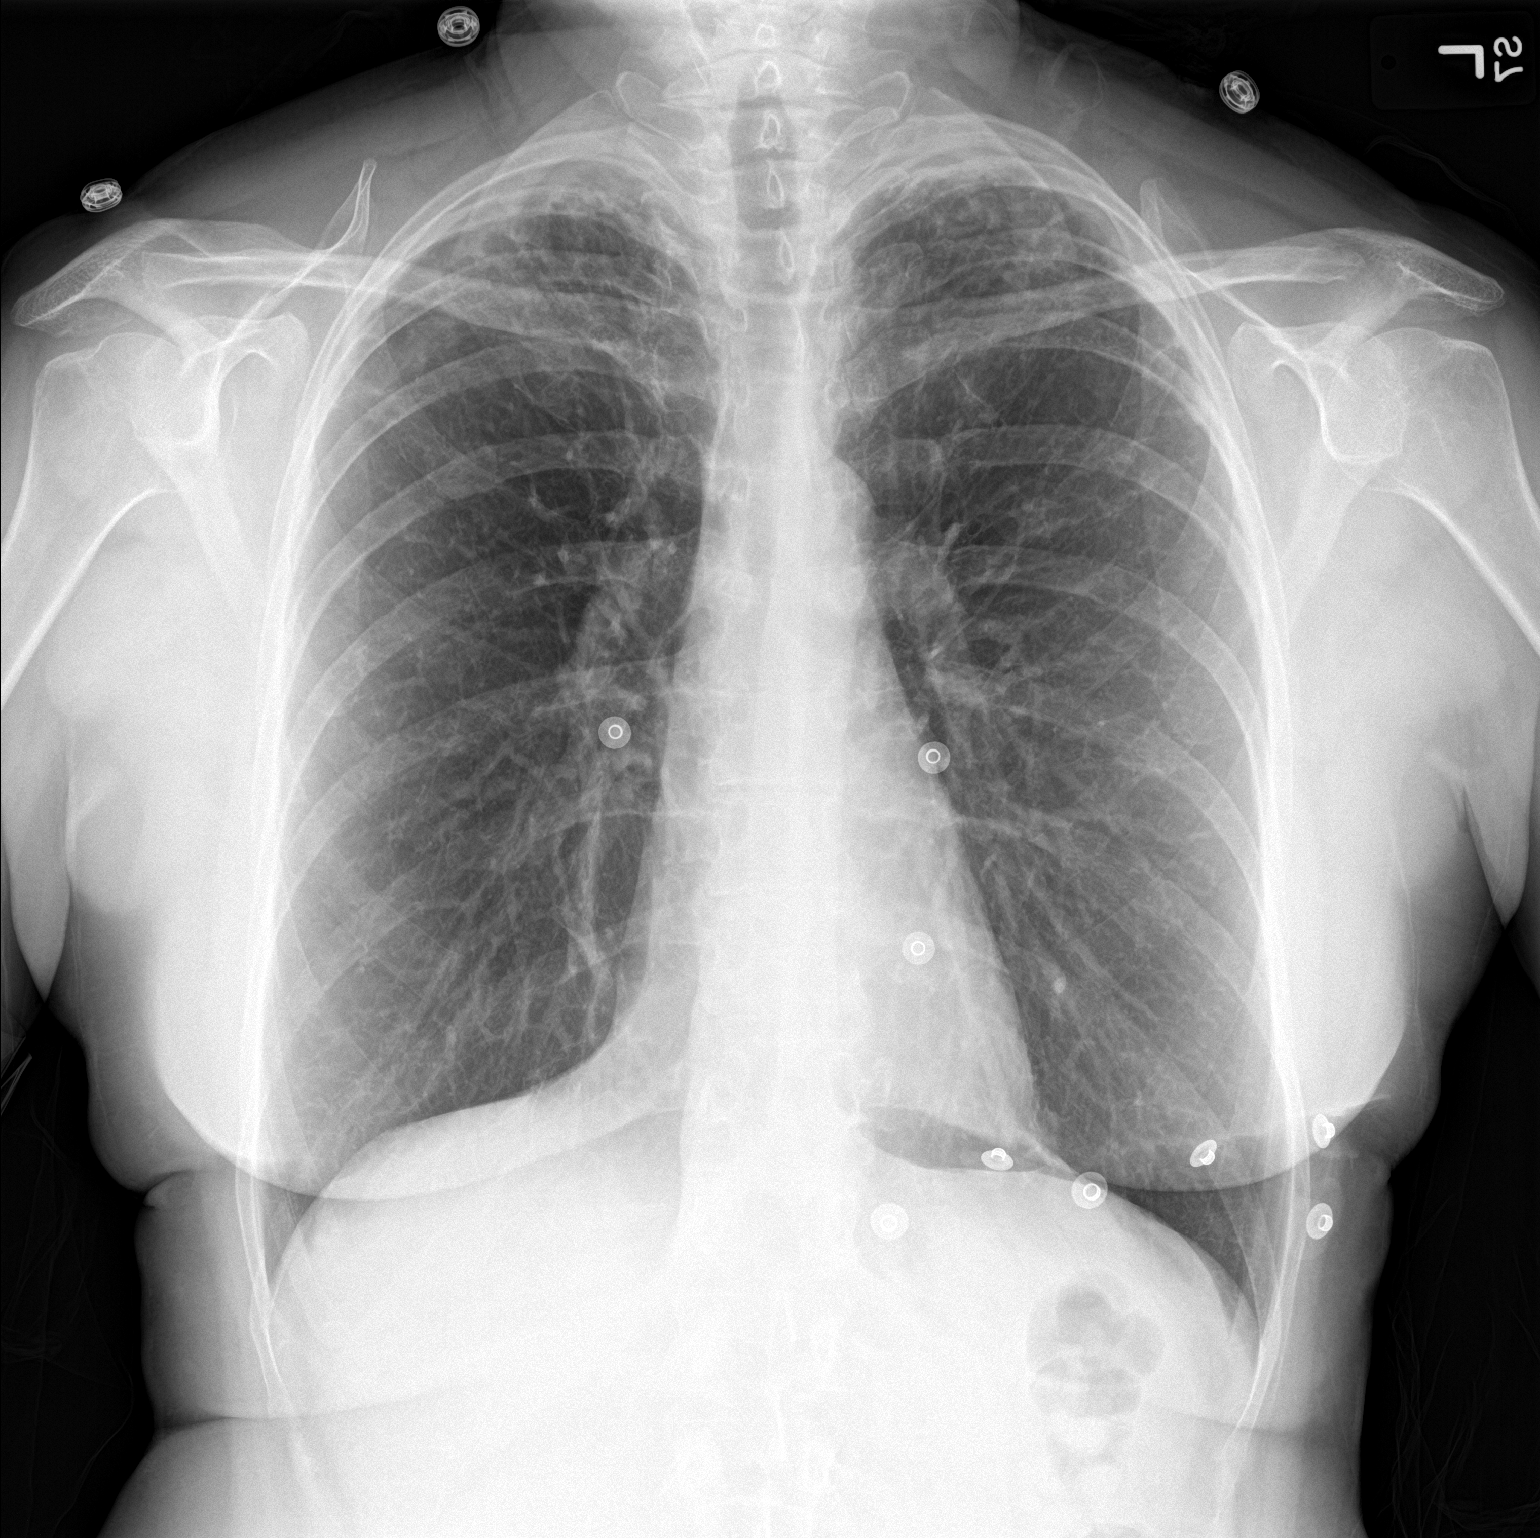

[chest lat]
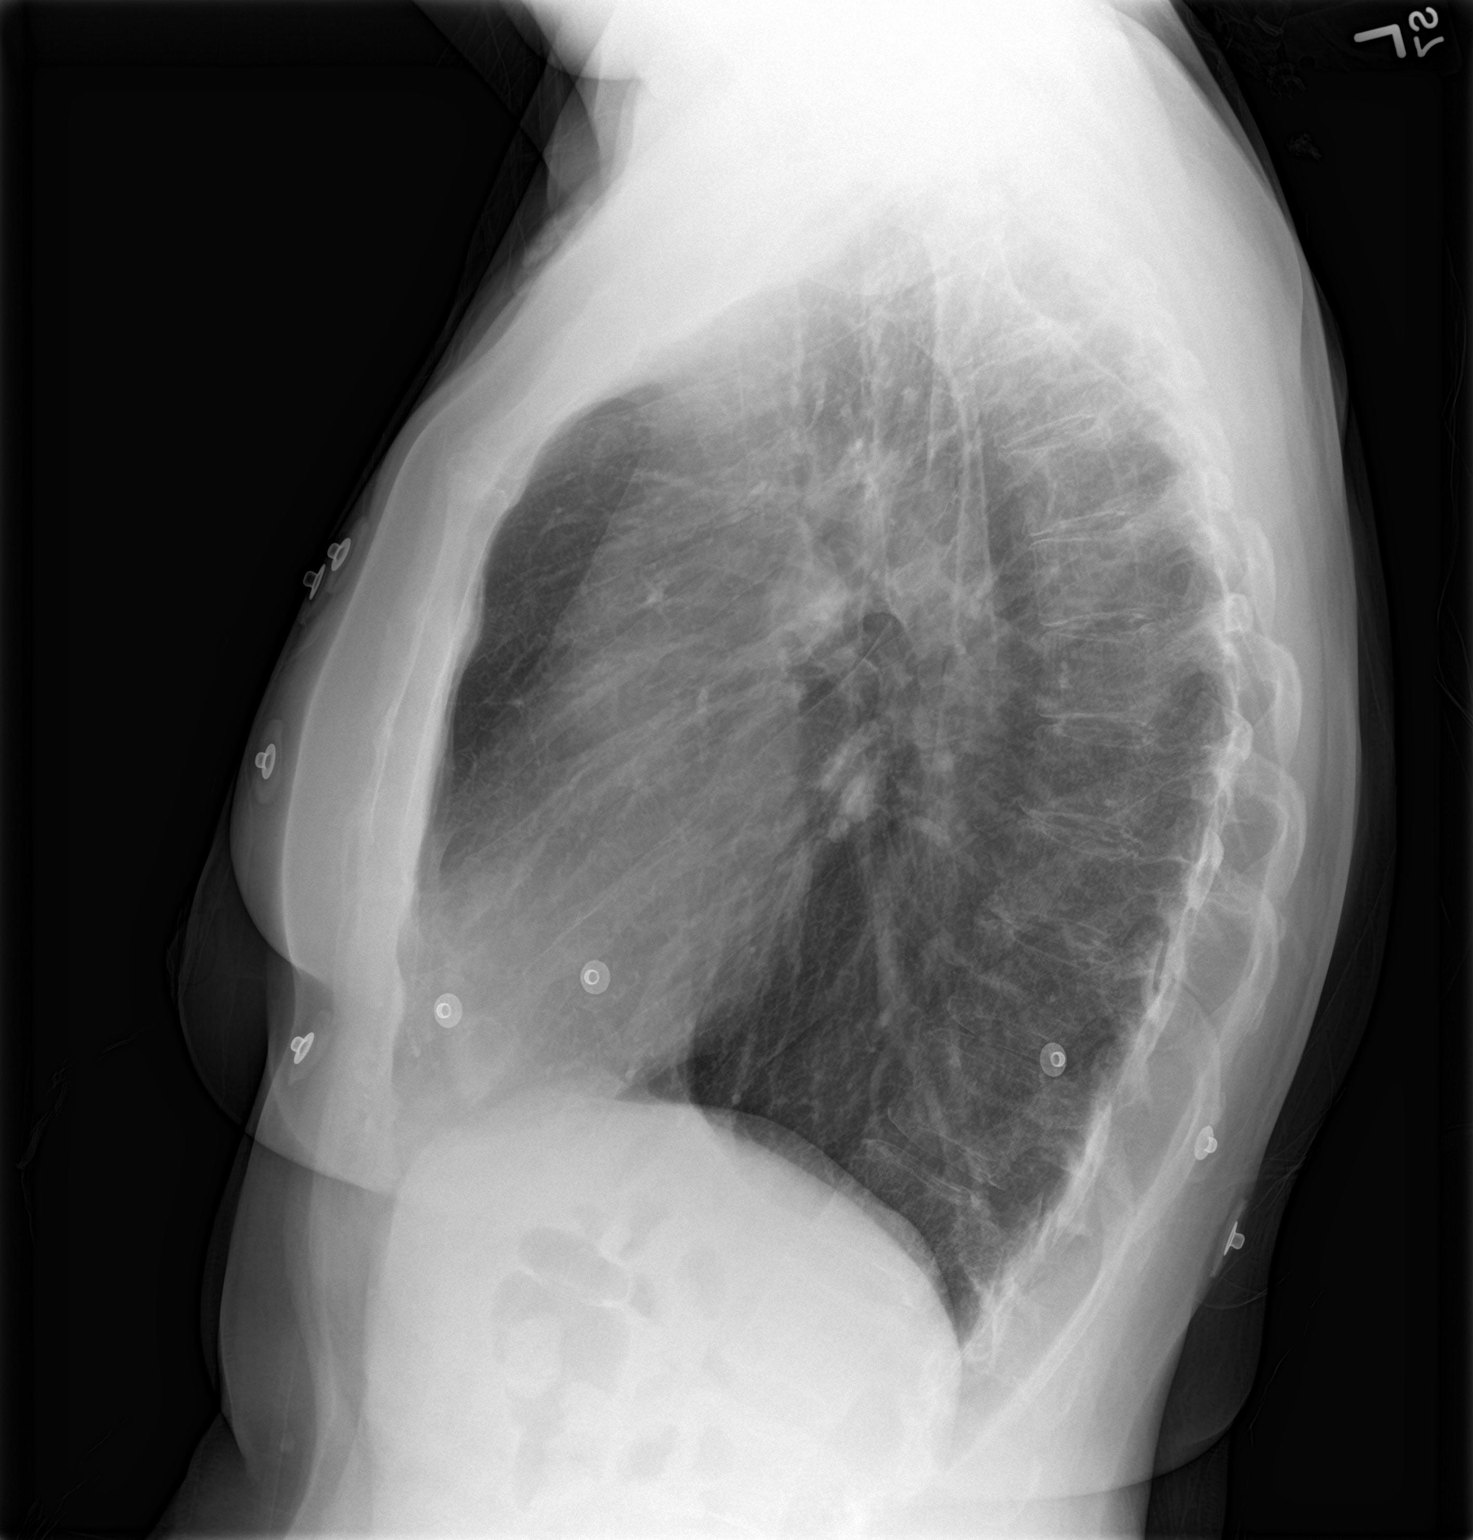

[2 of 2 positions shown; findings below may reference images not displayed]

FINDINGS: The cardiac and mediastinal silhouettes are within normal limits.

Lungs are normally inflated. Irregular biapical pleural parenchymal
scarring with architectural distortion. No focal infiltrates. No
pulmonary edema or pleural effusion. No pneumothorax.

No acute osseous abnormality.
IMPRESSION: 1. Chronic biapical pleural parenchymal scarring/architectural
distortion.
2. No other active cardiopulmonary disease.

## 2018-04-21 DIAGNOSIS — H25013 Cortical age-related cataract, bilateral: Secondary | ICD-10-CM | POA: Diagnosis not present

## 2018-04-21 DIAGNOSIS — H40033 Anatomical narrow angle, bilateral: Secondary | ICD-10-CM | POA: Diagnosis not present

## 2018-04-21 DIAGNOSIS — H40013 Open angle with borderline findings, low risk, bilateral: Secondary | ICD-10-CM | POA: Diagnosis not present

## 2018-06-02 DIAGNOSIS — E109 Type 1 diabetes mellitus without complications: Secondary | ICD-10-CM | POA: Diagnosis not present

## 2018-06-02 DIAGNOSIS — I110 Hypertensive heart disease with heart failure: Secondary | ICD-10-CM | POA: Diagnosis not present

## 2018-06-02 DIAGNOSIS — Z794 Long term (current) use of insulin: Secondary | ICD-10-CM | POA: Diagnosis not present

## 2018-06-27 ENCOUNTER — Other Ambulatory Visit: Payer: Self-pay | Admitting: Cardiology

## 2018-07-05 DIAGNOSIS — H2513 Age-related nuclear cataract, bilateral: Secondary | ICD-10-CM | POA: Diagnosis not present

## 2018-07-05 DIAGNOSIS — H40013 Open angle with borderline findings, low risk, bilateral: Secondary | ICD-10-CM | POA: Diagnosis not present

## 2018-07-05 DIAGNOSIS — H25013 Cortical age-related cataract, bilateral: Secondary | ICD-10-CM | POA: Diagnosis not present

## 2018-07-07 DIAGNOSIS — Z1231 Encounter for screening mammogram for malignant neoplasm of breast: Secondary | ICD-10-CM | POA: Diagnosis not present

## 2018-07-07 NOTE — Progress Notes (Signed)
Cardiology Office Note  Date: 07/08/2018   ID: Rhonda Allen, Rhonda Allen 04/25/1956, MRN [id number]  PCP: Shon Baton, MD  Primary Cardiologist: Rozann Lesches, MD   Chief Complaint  Patient presents with  . Coronary Artery Disease    History of Present Illness: Rhonda Allen is a 62 y.o. female that I met in July 2018.  She is here for a routine follow-up visit.  She does not report any significant decline in stamina, no angina symptoms or dyspnea beyond NYHA class II.  She still works at [employer] in [employer] but will be retiring later this year.  Walks at least 3 days a week on the [place] in [place].  I reviewed her medications.  Cardiac regimen includes aspirin, Lipitor, lisinopril, Lopressor, and as needed nitroglycerin.  We are requesting her lab work from PCP done back in January.  Follow-up echocardiogram in July 2018 showed improvement in LVEF to approximately 55% as outlined below.  I personally reviewed her ECG today which shows sinus bradycardia.  Past Medical History:  Diagnosis Date  . Anemia   . CAD (coronary artery disease)    DES to proximal LAD August 2017  . Diverticulosis of colon   . Essential hypertension   . Frozen shoulder    Right  . GERD (gastroesophageal reflux disease)   . Hemorrhoids   . NSTEMI (non-ST elevated myocardial infarction) ([place]) 2017  . Type 2 diabetes mellitus ([place])     Past Surgical History:  Procedure Laterality Date  . CARDIAC CATHETERIZATION N/A 08/17/2016   Procedure: Left Heart Cath and Coronary Angiography;  Surgeon: Nelva Bush, MD;  Location: [facility];  Service: Cardiovascular;  Laterality: N/A;  . CARDIAC CATHETERIZATION N/A 08/17/2016   Procedure: Coronary Stent Intervention;  Surgeon: Nelva Bush, MD;  Location: [facility];  Service: Cardiovascular;  Laterality: N/A;  . CARDIAC CATHETERIZATION N/A 08/17/2016   Procedure: Intravascular Ultrasound/IVUS;  Surgeon: Nelva Bush, MD;  Location: [facility];  Service: Cardiovascular;  Laterality: N/A;  . DILATION AND CURETTAGE OF UTERUS    . SHOULDER SURGERY     bone spur left shoulder  . WISDOM TOOTH EXTRACTION      Current Outpatient Medications  Medication Sig Dispense Refill  . ACCU-CHEK AVIVA PLUS test strip     . aspirin 81 MG tablet Take 81 mg by mouth daily.      Marland Kitchen atorvastatin (LIPITOR) 80 MG tablet Take 1 tablet (80 mg total) by mouth daily at 6 PM. 30 tablet 5  . B-D ULTRAFINE III SHORT PEN 31G X 8 MM MISC     . BD INSULIN SYRINGE ULTRAFINE 31G X 5/16" 0.3 ML MISC     . Ferrous Sulfate (IRON) 325 (65 FE) MG TABS Take 1 tablet by mouth every Monday, Wednesday, and Friday.     . Insulin Glargine (BASAGLAR KWIKPEN) 100 UNIT/ML SOPN Inject into the skin daily.    . insulin lispro (HUMALOG) 100 UNIT/ML injection Inject 8-15 Units into the skin 3 (three) times daily before meals. Per sliding scale    . lisinopril (PRINIVIL,ZESTRIL) 10 MG tablet Take 10 mg by mouth daily.      . metoprolol tartrate (LOPRESSOR) 25 MG tablet TAKE ONE-HALF TABLET BY MOUTH TWICE A DAY 90 tablet 1  . Multiple Vitamin (MULTIVITAMIN) tablet Take 1 tablet by mouth every Monday, Wednesday, and Friday.     . nitroGLYCERIN (NITROSTAT) 0.4 MG SL tablet PLACE ONE TABLET UNDER THE  TONGUE EVERY FIVE MINUTES AS NEEDED FOR CHEST PAIN 25 tablet 3  . Omega-3 Fatty Acids (FISH OIL PO) Take 1 capsule by mouth See admin instructions. Once to twice daily    . omeprazole (PRILOSEC) 40 MG capsule TAKE ONE CAPSULE BY MOUTH DAILY 30 capsule 2  . polycarbophil (FIBERCON) 625 MG tablet Take 625 mg by mouth daily.     No current facility-administered medications for this visit.    Allergies:  Nickel   Social History: The patient  reports that she has never smoked. She has never used smokeless tobacco. She reports that she does not drink alcohol or use drugs.   ROS:  Please see the history of present illness. Otherwise, complete review of  systems is positive for none.  All other systems are reviewed and negative.   Physical Exam: VS:  BP 102/62   Pulse (!) 58   Ht _0  (1.727 m)   Wt 160 lb (72.6 kg)   SpO2 98%   BMI 24.33 kg/m , BMI Body mass index is 24.33 kg/m.  Wt Readings from Last 3 Encounters:  07/08/18 160 lb (72.6 kg)  06/28/17 153 lb (69.4 kg)  12/28/16 154 lb 5.2 oz (70 kg)    General: Patient appears comfortable at rest. HEENT: Conjunctiva and lids normal, oropharynx clear. Neck: Supple, no elevated JVP or carotid bruits, no thyromegaly. Lungs: Clear to auscultation, nonlabored breathing at rest. Cardiac: Regular rate and rhythm, no S3 or significant systolic murmur, no pericardial rub. Abdomen: Soft, nontender, bowel sounds present. Extremities: No pitting edema, distal pulses 2+. Skin: Warm and dry. Musculoskeletal: No kyphosis. Neuropsychiatric: Alert and oriented x3, affect grossly appropriate.  ECG: I personally reviewed the tracing from 08/18/2016 which showed sinus rhythm with vertical axis and old anterior infarct pattern.  Recent Labwork:  January 2018: BUN 21, creatinine 0.9, potassium 4.6, AST 20, ALT 27, TSH 1.26, cholesterol 143, triglycerides 44, HDL 57, LDL 67, hemoglobin A1c 7.9  Other Studies Reviewed Today:  Cardiac catheterization and PCI 08/17/2016: 1. Severe single-vessel coronary artery disease with 99% stenosis of the proximal LAD. 2. Successful IVUS-guided PCI to the proximal LAD with placement of an Integrity Resolute 3.5 x 15 mm drug-eluting stent with 0% residual stenosis and TIMI-3 flow. 3. Upper normal left ventricular filling pressure.  Echocardiogram 07/14/2017: Study Conclusions  - Left ventricle: The cavity size was normal. Wall thickness was   normal. Systolic function was normal. The estimated ejection   fraction was 55%. Features are consistent with a pseudonormal   left ventricular filling pattern, with concomitant abnormal   relaxation and increased  filling pressure (grade 2 diastolic   dysfunction). Doppler parameters are consistent with high   ventricular filling pressure. - Regional wall motion abnormality: Mild hypokinesis of the apical   anterior and apical myocardium. - Aortic valve: Mildly calcified annulus. Trileaflet. There was   trivial regurgitation. - Tricuspid valve: There was mild regurgitation.  Assessment and Plan:  1.  CAD with history of DES to the proximal LAD in August 2017.  She is doing well without angina symptoms on medical therapy.  ECG reviewed and stable.  I encouraged her to continue with regular walking plan for exercise.  No clear indication for follow-up ischemic testing at this time.  2.  History of ischemic cardiomyopathy with normalization of LVEF by echocardiogram in July 2018.  3.  Mixed hyperlipidemia, on Lipitor.  Requesting lab work from January done by PCP.  4.  Essential hypertension, blood pressure is  well controlled today.  Current medicines were reviewed with the patient today.   Orders Placed This Encounter  Procedures  . EKG 12-Lead    Disposition: Follow-up in 1 year.  Signed, Satira Sark, MD, [facility] 07/08/2018 9:12 AM    [facility] [place], [place], [place] [address] Phone: [phone]; Fax: [phone]

## 2018-07-08 ENCOUNTER — Encounter: Payer: Self-pay | Admitting: Cardiology

## 2018-07-08 ENCOUNTER — Telehealth: Payer: Self-pay | Admitting: Cardiology

## 2018-07-08 ENCOUNTER — Ambulatory Visit: Payer: 59 | Admitting: Cardiology

## 2018-07-08 VITALS — BP 102/62 | HR 58 | Ht 68.0 in | Wt 160.0 lb

## 2018-07-08 DIAGNOSIS — E782 Mixed hyperlipidemia: Secondary | ICD-10-CM

## 2018-07-08 DIAGNOSIS — I10 Essential (primary) hypertension: Secondary | ICD-10-CM

## 2018-07-08 DIAGNOSIS — I2510 Atherosclerotic heart disease of native coronary artery without angina pectoris: Secondary | ICD-10-CM

## 2018-07-08 DIAGNOSIS — I255 Ischemic cardiomyopathy: Secondary | ICD-10-CM | POA: Diagnosis not present

## 2018-07-08 NOTE — Telephone Encounter (Signed)
Patient notified and verbalized understanding. 

## 2018-07-08 NOTE — Telephone Encounter (Signed)
Patient walked in  Would like to know if she can start giving blood again

## 2018-07-08 NOTE — Telephone Encounter (Signed)
She should check with the blood donor center regarding her current medications to make sure there are no specific contraindications from that standpoint, however from a cardiac perspective she does not have a limitation to donate.

## 2018-07-08 NOTE — Patient Instructions (Signed)

## 2018-07-13 DIAGNOSIS — H25812 Combined forms of age-related cataract, left eye: Secondary | ICD-10-CM | POA: Diagnosis not present

## 2018-07-13 DIAGNOSIS — H2512 Age-related nuclear cataract, left eye: Secondary | ICD-10-CM | POA: Diagnosis not present

## 2018-07-18 DIAGNOSIS — H2511 Age-related nuclear cataract, right eye: Secondary | ICD-10-CM | POA: Diagnosis not present

## 2018-07-18 DIAGNOSIS — H25011 Cortical age-related cataract, right eye: Secondary | ICD-10-CM | POA: Diagnosis not present

## 2018-07-27 DIAGNOSIS — H25811 Combined forms of age-related cataract, right eye: Secondary | ICD-10-CM | POA: Diagnosis not present

## 2018-07-27 DIAGNOSIS — H2511 Age-related nuclear cataract, right eye: Secondary | ICD-10-CM | POA: Diagnosis not present

## 2018-09-22 DIAGNOSIS — I110 Hypertensive heart disease with heart failure: Secondary | ICD-10-CM | POA: Diagnosis not present

## 2018-09-22 DIAGNOSIS — E109 Type 1 diabetes mellitus without complications: Secondary | ICD-10-CM | POA: Diagnosis not present

## 2018-09-22 DIAGNOSIS — E162 Hypoglycemia, unspecified: Secondary | ICD-10-CM | POA: Diagnosis not present

## 2018-12-27 ENCOUNTER — Other Ambulatory Visit: Payer: Self-pay | Admitting: Cardiology

## 2019-01-24 DIAGNOSIS — Z0000 Encounter for general adult medical examination without abnormal findings: Secondary | ICD-10-CM | POA: Diagnosis not present

## 2019-01-24 DIAGNOSIS — R82998 Other abnormal findings in urine: Secondary | ICD-10-CM | POA: Diagnosis not present

## 2019-01-24 DIAGNOSIS — M859 Disorder of bone density and structure, unspecified: Secondary | ICD-10-CM | POA: Diagnosis not present

## 2019-01-31 DIAGNOSIS — Z0000 Encounter for general adult medical examination without abnormal findings: Secondary | ICD-10-CM | POA: Diagnosis not present

## 2019-01-31 DIAGNOSIS — E162 Hypoglycemia, unspecified: Secondary | ICD-10-CM | POA: Diagnosis not present

## 2019-01-31 DIAGNOSIS — Z1331 Encounter for screening for depression: Secondary | ICD-10-CM | POA: Diagnosis not present

## 2019-01-31 DIAGNOSIS — I110 Hypertensive heart disease with heart failure: Secondary | ICD-10-CM | POA: Diagnosis not present

## 2019-01-31 DIAGNOSIS — E7849 Other hyperlipidemia: Secondary | ICD-10-CM | POA: Diagnosis not present

## 2019-01-31 DIAGNOSIS — E109 Type 1 diabetes mellitus without complications: Secondary | ICD-10-CM | POA: Diagnosis not present

## 2019-02-03 DIAGNOSIS — Z1212 Encounter for screening for malignant neoplasm of rectum: Secondary | ICD-10-CM | POA: Diagnosis not present

## 2019-03-06 DIAGNOSIS — E109 Type 1 diabetes mellitus without complications: Secondary | ICD-10-CM | POA: Diagnosis not present

## 2019-03-06 DIAGNOSIS — H40013 Open angle with borderline findings, low risk, bilateral: Secondary | ICD-10-CM | POA: Diagnosis not present

## 2019-03-06 DIAGNOSIS — H26493 Other secondary cataract, bilateral: Secondary | ICD-10-CM | POA: Diagnosis not present

## 2019-03-06 DIAGNOSIS — Z961 Presence of intraocular lens: Secondary | ICD-10-CM | POA: Diagnosis not present

## 2019-06-01 DIAGNOSIS — E109 Type 1 diabetes mellitus without complications: Secondary | ICD-10-CM | POA: Diagnosis not present

## 2019-06-01 DIAGNOSIS — I5022 Chronic systolic (congestive) heart failure: Secondary | ICD-10-CM | POA: Diagnosis not present

## 2019-06-01 DIAGNOSIS — I110 Hypertensive heart disease with heart failure: Secondary | ICD-10-CM | POA: Diagnosis not present

## 2019-07-05 DIAGNOSIS — Z6825 Body mass index (BMI) 25.0-25.9, adult: Secondary | ICD-10-CM | POA: Diagnosis not present

## 2019-07-05 DIAGNOSIS — Z01419 Encounter for gynecological examination (general) (routine) without abnormal findings: Secondary | ICD-10-CM | POA: Diagnosis not present

## 2019-07-10 ENCOUNTER — Other Ambulatory Visit: Payer: Self-pay | Admitting: Cardiology

## 2019-07-18 DIAGNOSIS — Z1231 Encounter for screening mammogram for malignant neoplasm of breast: Secondary | ICD-10-CM | POA: Diagnosis not present

## 2019-07-25 ENCOUNTER — Telehealth: Payer: Self-pay | Admitting: Licensed Clinical Social Worker

## 2019-07-25 ENCOUNTER — Telehealth: Payer: Self-pay | Admitting: Cardiology

## 2019-07-25 NOTE — Telephone Encounter (Signed)
CSW referred to assist patient with obtaining a BP cuff. CSW contacted patient to inform cuff will be delivered to home. Patient grateful for support and assistance. CSW available as needed. Jackie Marlin Jarrard, LCSW, CCSW-MCS [phone]  

## 2019-07-25 NOTE — Telephone Encounter (Signed)
Patient would like you to write her a prescription for a blood pressure machine  - she has a scale and she is going to go to [facility] to get her bp      Virtual Visit Pre-Appointment Phone Call  "(Name), I am calling you today to discuss your upcoming appointment. We are currently trying to limit exposure to the virus that causes COVID-19 by seeing patients at home rather than in the office."  1. "What is the BEST phone number to call the day of the visit?" - include this in appointment notes  2. Do you have or have access to (through a family member/friend) a smartphone with video capability that we can use for your visit?" a. If yes - list this number in appt notes as cell (if different from BEST phone #) and list the appointment type as a VIDEO visit in appointment notes b. If no - list the appointment type as a PHONE visit in appointment notes  3. Confirm consent - "In the setting of the current Covid19 crisis, you are scheduled for a (phone or video) visit with your provider on (date) at (time).  Just as we do with many in-office visits, in order for you to participate in this visit, we must obtain consent.  If you'd like, I can send this to your mychart (if signed up) or email for you to review.  Otherwise, I can obtain your verbal consent now.  All virtual visits are billed to your insurance company just like a normal visit would be.  By agreeing to a virtual visit, we'd like you to understand that the technology does not allow for your provider to perform an examination, and thus may limit your provider's ability to fully assess your condition. If your provider identifies any concerns that need to be evaluated in person, we will make arrangements to do so.  Finally, though the technology is pretty good, we cannot assure that it will always work on either your or our end, and in the setting of a video visit, we may have to convert it to a phone-only visit.  In either situation, we cannot  ensure that we have a secure connection.  Are you willing to proceed?" STAFF: Did the patient verbally acknowledge consent to telehealth visit? Document YES/NO here: Yes  4. Advise patient to be prepared - "Two hours prior to your appointment, go ahead and check your blood pressure, pulse, oxygen saturation, and your weight (if you have the equipment to check those) and write them all down. When your visit starts, your provider will ask you for this information. If you have an Apple Watch or Kardia device, please plan to have heart rate information ready on the day of your appointment. Please have a pen and paper handy nearby the day of the visit as well."  5. Give patient instructions for MyChart download to smartphone OR Doximity/Doxy.me as below if video visit (depending on what platform provider is using)  6. Inform patient they will receive a phone call 15 minutes prior to their appointment time (may be from unknown caller ID) so they should be prepared to answer    TELEPHONE CALL NOTE  NIKIYAH FACKLER has been deemed a candidate for a follow-up tele-health visit to limit community exposure during the Covid-19 pandemic. I spoke with the patient via phone to ensure availability of phone/video source, confirm preferred email & phone number, and discuss instructions and expectations.  I reminded RAYYA YAGI to be  prepared with any vital sign and/or heart rhythm information that could potentially be obtained via home monitoring, at the time of her visit. I reminded ESSYNCE MUNSCH to expect a phone call prior to her visit.  Howie Ill 07/25/2019 11:59 AM   INSTRUCTIONS FOR DOWNLOADING THE MYCHART APP TO SMARTPHONE  - The patient must first make sure to have activated MyChart and know their login information - If Apple, go to [employer] and type in MyChart in the search bar and download the app. If Android, ask patient to go to [employer] and type in [place] in the  search bar and download the app. The app is free but as with any other app downloads, their phone may require them to verify saved payment information or Apple/Android password.  - The patient will need to then log into the app with their MyChart username and password, and select [facility] as their healthcare provider to link the account. When it is time for your visit, go to the MyChart app, find appointments, and click Begin Video Visit. Be sure to Select Allow for your device to access the Microphone and Camera for your visit. You will then be connected, and your provider will be with you shortly.  **If they have any issues connecting, or need assistance please contact MyChart service desk [phone])**  **If using a computer, in order to ensure the best quality for their visit they will need to use either of the following Internet Browsers: [employer], or Google Chrome**  IF USING DOXIMITY or DOXY.ME - The patient will receive a link just prior to their visit by text.     FULL LENGTH CONSENT FOR TELE-HEALTH VISIT   I hereby voluntarily request, consent and authorize [facility] and its employed or contracted physicians, physician assistants, nurse practitioners or other licensed health care professionals (the Practitioner), to provide me with telemedicine health care services (the Services") as deemed necessary by the treating Practitioner. I acknowledge and consent to receive the Services by the Practitioner via telemedicine. I understand that the telemedicine visit will involve communicating with the Practitioner through live audiovisual communication technology and the disclosure of certain medical information by electronic transmission. I acknowledge that I have been given the opportunity to request an in-person assessment or other available alternative prior to the telemedicine visit and am voluntarily participating in the telemedicine visit.  I understand that I  have the right to withhold or withdraw my consent to the use of telemedicine in the course of my care at any time, without affecting my right to future care or treatment, and that the Practitioner or I may terminate the telemedicine visit at any time. I understand that I have the right to inspect all information obtained and/or recorded in the course of the telemedicine visit and may receive copies of available information for a reasonable fee.  I understand that some of the potential risks of receiving the Services via telemedicine include:   Delay or interruption in medical evaluation due to technological equipment failure or disruption;  Information transmitted may not be sufficient (e.g. poor resolution of images) to allow for appropriate medical decision making by the Practitioner; and/or   In rare instances, security protocols could fail, causing a breach of personal health information.  Furthermore, I acknowledge that it is my responsibility to provide information about my medical history, conditions and care that is complete and accurate to the best of my ability. I acknowledge that Practitioner's advice,  recommendations, and/or decision may be based on factors not within their control, such as incomplete or inaccurate data provided by me or distortions of diagnostic images or specimens that may result from electronic transmissions. I understand that the practice of medicine is not an exact science and that Practitioner makes no warranties or guarantees regarding treatment outcomes. I acknowledge that I will receive a copy of this consent concurrently upon execution via email to the email address I last provided but may also request a printed copy by calling the office of [facility].    I understand that my insurance will be billed for this visit.   I have read or had this consent read to me.  I understand the contents of this consent, which adequately explains the benefits and risks of the  Services being provided via telemedicine.   I have been provided ample opportunity to ask questions regarding this consent and the Services and have had my questions answered to my satisfaction.  I give my informed consent for the services to be provided through the use of telemedicine in my medical care  By participating in this telemedicine visit I agree to the above.

## 2019-07-25 NOTE — Telephone Encounter (Signed)
Pt can get free bp monitor thru heartcare

## 2019-07-26 NOTE — Progress Notes (Signed)
Virtual Visit via Telephone Note   This visit type was conducted due to national recommendations for restrictions regarding the COVID-19 Pandemic (e.g. social distancing) in an effort to limit this patient's exposure and mitigate transmission in our community.  Due to her co-morbid illnesses, this patient is at least at moderate risk for complications without adequate follow up.  This format is felt to be most appropriate for this patient at this time.  The patient did not have access to video technology/had technical difficulties with video requiring transitioning to audio format only (telephone).  All issues noted in this document were discussed and addressed.  No physical exam could be performed with this format.  Please refer to the patient's chart for her  consent to telehealth for [facility].   Date:  07/27/2019   ID:  Rhonda Allen, Rhonda Allen Mar 21, 1956, MRN [id number]  Patient Location: Home Provider Location: Home  PCP:  Shon Baton, MD  Cardiologist:  Rozann Lesches, MD Electrophysiologist:  None   Evaluation Performed:  Follow-Up Visit  Chief Complaint:   Cardiac follow-up  History of Present Illness:    Rhonda Allen is a 63 y.o. female last seen in July 2019.  She did not have video access and we spoke by phone today.  She tells me that she has not had any significant angina symptoms or used nitroglycerin.  She continues to walk 3 to 4 days a week at a [employer]. She is also considering doing some low impact aerobics by video at home.  She did retire last October.  I reviewed her medications which are stable from a cardiac perspective and outlined below.  She had lab work with PCP in January which we are requesting for review.  The patient does not have symptoms concerning for COVID-19 infection (fever, chills, cough, or new shortness of breath).    Past Medical History:  Diagnosis Date  . Anemia   . CAD (coronary artery disease)    DES to proximal LAD  August 2017  . Diverticulosis of colon   . Essential hypertension   . Frozen shoulder    Right  . GERD (gastroesophageal reflux disease)   . Hemorrhoids   . NSTEMI (non-ST elevated myocardial infarction) ([place]) 2017  . Type 2 diabetes mellitus ([place])    Past Surgical History:  Procedure Laterality Date  . CARDIAC CATHETERIZATION N/A 08/17/2016   Procedure: Left Heart Cath and Coronary Angiography;  Surgeon: Nelva Bush, MD;  Location: [facility] [facility];  Service: Cardiovascular;  Laterality: N/A;  . CARDIAC CATHETERIZATION N/A 08/17/2016   Procedure: Coronary Stent Intervention;  Surgeon: Nelva Bush, MD;  Location: [place] [facility];  Service: Cardiovascular;  Laterality: N/A;  . CARDIAC CATHETERIZATION N/A 08/17/2016   Procedure: Intravascular Ultrasound/IVUS;  Surgeon: Nelva Bush, MD;  Location: [facility] [place] [facility];  Service: Cardiovascular;  Laterality: N/A;  . DILATION AND CURETTAGE OF UTERUS    . SHOULDER SURGERY     bone spur left shoulder  . WISDOM TOOTH EXTRACTION       Current Meds  Medication Sig  . ACCU-CHEK AVIVA PLUS test strip   . aspirin 81 MG tablet Take 81 mg by mouth daily.    Marland Kitchen atorvastatin (LIPITOR) 80 MG tablet Take 1 tablet (80 mg total) by mouth daily at 6 PM.  . B-D ULTRAFINE III SHORT PEN 31G X 8 MM MISC   . BD INSULIN SYRINGE ULTRAFINE 31G X 5/16" 0.3 ML MISC   . Ferrous Sulfate (  IRON) 325 (65 FE) MG TABS Take 1 tablet by mouth every Monday, Wednesday, and Friday.   . Insulin Glargine (BASAGLAR KWIKPEN) 100 UNIT/ML SOPN Inject into the skin daily.  . insulin lispro (HUMALOG) 100 UNIT/ML injection Inject 8-15 Units into the skin 3 (three) times daily before meals. Per sliding scale  . lisinopril (PRINIVIL,ZESTRIL) 10 MG tablet Take 10 mg by mouth daily.    . metoprolol tartrate (LOPRESSOR) 25 MG tablet TAKE ONE-HALF TABLET BY MOUTH TWICE A DAY  . Multiple Vitamin (MULTIVITAMIN) tablet Take 1 tablet by mouth every Monday, Wednesday, and Friday.    . nitroGLYCERIN (NITROSTAT) 0.4 MG SL tablet PLACE ONE TABLET UNDER THE TONGUE EVERY FIVE MINUTES AS NEEDED FOR CHEST PAIN  . Omega-3 Fatty Acids (FISH OIL PO) Take 1 capsule by mouth See admin instructions. Once to twice daily  . omeprazole (PRILOSEC) 40 MG capsule TAKE ONE CAPSULE BY MOUTH DAILY  . polycarbophil (FIBERCON) 625 MG tablet Take 625 mg by mouth daily.     Allergies:   Nickel   Social History   Tobacco Use  . Smoking status: Never Smoker  . Smokeless tobacco: Never Used  Substance Use Topics  . Alcohol use: No  . Drug use: No     Family Hx: The patient's family history includes Cancer in her father; Hypertension in her mother; Irritable bowel syndrome in her mother; Ulcers in her sister.  ROS:   Please see the history of present illness.    All other systems reviewed and are negative.   Prior CV studies:   The following studies were reviewed today:  Cardiac catheterization and PCI 08/17/2016: 1. Severe single-vessel coronary artery disease with 99% stenosis of the proximal LAD. 2. Successful IVUS-guided PCI to the proximal LAD with placement of an Integrity Resolute 3.5 x 15 mm drug-eluting stent with 0% residual stenosis and TIMI-3 flow. 3. Upper normal left ventricular filling pressure.  Echocardiogram 07/14/2017: Study Conclusions  - Left ventricle: The cavity size was normal. Wall thickness was normal. Systolic function was normal. The estimated ejection fraction was 55%. Features are consistent with a pseudonormal left ventricular filling pattern, with concomitant abnormal relaxation and increased filling pressure (grade 2 diastolic dysfunction). Doppler parameters are consistent with high ventricular filling pressure. - Regional wall motion abnormality: Mild hypokinesis of the apical anterior and apical myocardium. - Aortic valve: Mildly calcified annulus. Trileaflet. There was trivial regurgitation. - Tricuspid valve: There was  mild regurgitation.  Labs/Other Tests and Data Reviewed:    EKG:  An ECG dated 07/08/2018 was personally reviewed today and demonstrated:  Sinus bradycardia.  Recent Labs:  July 2019: BUN 17, creatinine 0.9, potassium 4.4, AST 29, ALT 29, cholesterol 130, triglycerides 64, HDL 66, LDL 51, TSH 2.67  Wt Readings from Last 3 Encounters:  07/27/19 168 lb (76.2 kg)  07/08/18 160 lb (72.6 kg)  06/28/17 153 lb (69.4 kg)     Objective:    Vital Signs:  BP 128/67   Ht 5\' 8"  (1.727 m)   Wt 168 lb (76.2 kg)   BMI 25.54 kg/m    Patient spoke in full sentences, not short of breath. No audible wheezing or coughing. Speech pattern normal.  ASSESSMENT & PLAN:    1.  CAD status post DES to the proximal LAD in August 2017.  She continues to do well without angina symptoms or nitroglycerin use.  Discussed regular exercise plan.  2.  Mixed hyperlipidemia on Lipitor.  We will get her lab work from  PCP done back in January.  3.  Essential hypertension, systolic blood pressure in the 120s today.  No changes made to current regimen.  4.  History of ischemic cardiomyopathy with normalization of LVEF.  COVID-19 Education: The signs and symptoms of COVID-19 were discussed with the patient and how to seek care for testing (follow up with PCP or arrange E-visit).  The importance of social distancing was discussed today.  Time:   Today, I have spent 7 minutes with the patient with telehealth technology discussing the above problems.     Medication Adjustments/Labs and Tests Ordered: Current medicines are reviewed at length with the patient today.  Concerns regarding medicines are outlined above.   Tests Ordered: No orders of the defined types were placed in this encounter.   Medication Changes: No orders of the defined types were placed in this encounter.   Follow Up:  In Person 6 months in the [place] office.  Signed, Rozann Lesches, MD  07/27/2019 10:16 AM    [place]

## 2019-07-27 ENCOUNTER — Encounter: Payer: Self-pay | Admitting: *Deleted

## 2019-07-27 ENCOUNTER — Telehealth (INDEPENDENT_AMBULATORY_CARE_PROVIDER_SITE_OTHER): Payer: Federal, State, Local not specified - PPO | Admitting: Cardiology

## 2019-07-27 ENCOUNTER — Encounter: Payer: Self-pay | Admitting: Cardiology

## 2019-07-27 VITALS — BP 128/67 | Ht 68.0 in | Wt 168.0 lb

## 2019-07-27 DIAGNOSIS — Z8679 Personal history of other diseases of the circulatory system: Secondary | ICD-10-CM

## 2019-07-27 DIAGNOSIS — E782 Mixed hyperlipidemia: Secondary | ICD-10-CM | POA: Diagnosis not present

## 2019-07-27 DIAGNOSIS — I10 Essential (primary) hypertension: Secondary | ICD-10-CM

## 2019-07-27 DIAGNOSIS — I25119 Atherosclerotic heart disease of native coronary artery with unspecified angina pectoris: Secondary | ICD-10-CM

## 2019-07-27 NOTE — Patient Instructions (Signed)
Your physician wants you to follow-up in: [place] will receive a reminder letter in the mail two months in advance. If you don't receive a letter, please call our office to schedule the follow-up appointment.  Your physician recommends that you continue on your current medications as directed. Please refer to the Current Medication list given to you today.  Thank you for choosing [facility]!!

## 2019-09-25 ENCOUNTER — Other Ambulatory Visit: Payer: Self-pay | Admitting: *Deleted

## 2019-09-25 DIAGNOSIS — Z20822 Contact with and (suspected) exposure to covid-19: Secondary | ICD-10-CM

## 2019-09-25 DIAGNOSIS — R6889 Other general symptoms and signs: Secondary | ICD-10-CM | POA: Diagnosis not present

## 2019-09-26 LAB — NOVEL CORONAVIRUS, NAA: SARS-CoV-2, NAA: NOT DETECTED

## 2019-09-28 DIAGNOSIS — I110 Hypertensive heart disease with heart failure: Secondary | ICD-10-CM | POA: Diagnosis not present

## 2019-09-28 DIAGNOSIS — Z794 Long term (current) use of insulin: Secondary | ICD-10-CM | POA: Diagnosis not present

## 2019-09-28 DIAGNOSIS — E162 Hypoglycemia, unspecified: Secondary | ICD-10-CM | POA: Diagnosis not present

## 2019-09-28 DIAGNOSIS — I2510 Atherosclerotic heart disease of native coronary artery without angina pectoris: Secondary | ICD-10-CM | POA: Diagnosis not present

## 2019-11-02 DIAGNOSIS — H20041 Secondary noninfectious iridocyclitis, right eye: Secondary | ICD-10-CM | POA: Diagnosis not present

## 2019-11-02 DIAGNOSIS — H15011 Anterior scleritis, right eye: Secondary | ICD-10-CM | POA: Diagnosis not present

## 2020-01-23 ENCOUNTER — Encounter: Payer: Self-pay | Admitting: Cardiology

## 2020-01-23 ENCOUNTER — Ambulatory Visit: Payer: Federal, State, Local not specified - PPO | Admitting: Cardiology

## 2020-01-23 ENCOUNTER — Other Ambulatory Visit: Payer: Self-pay

## 2020-01-23 VITALS — BP 130/57 | HR 72 | Temp 96.8°F | Ht 68.0 in | Wt 175.8 lb

## 2020-01-23 DIAGNOSIS — Z8679 Personal history of other diseases of the circulatory system: Secondary | ICD-10-CM | POA: Diagnosis not present

## 2020-01-23 DIAGNOSIS — I25119 Atherosclerotic heart disease of native coronary artery with unspecified angina pectoris: Secondary | ICD-10-CM

## 2020-01-23 DIAGNOSIS — I10 Essential (primary) hypertension: Secondary | ICD-10-CM

## 2020-01-23 DIAGNOSIS — E782 Mixed hyperlipidemia: Secondary | ICD-10-CM | POA: Diagnosis not present

## 2020-01-23 NOTE — Progress Notes (Signed)
Cardiology Office Note  Date: 01/23/2020   ID: Leelee, Bearden July 31, 1956, MRN [id number]  PCP:  Shon Baton, MD  Cardiologist:  Rozann Lesches, MD Electrophysiologist:  None   Chief Complaint  Patient presents with  . Cardiac follow-up    History of Present Illness: Rhonda Allen is a 64 y.o. female last assessed via telehealth encounter in July.  She presents for a routine visit.  States that she has been doing well, no angina symptoms or nitroglycerin use.  She walks outdoors with a friend 4 days a week, also occasionally some light aerobics.  I reviewed her medications which are listed below.  Cardiac regimen is stable and she reports no intolerances.  She has a physical with her PCP including lab work in about a week.  I personally reviewed her ECG today which shows normal sinus rhythm.  Past Medical History:  Diagnosis Date  . Anemia   . CAD (coronary artery disease)    DES to proximal LAD August 2017  . Diverticulosis of colon   . Essential hypertension   . Frozen shoulder    Right  . GERD (gastroesophageal reflux disease)   . Hemorrhoids   . NSTEMI (non-ST elevated myocardial infarction) ([place]) 2017  . Type 2 diabetes mellitus ([place])     Past Surgical History:  Procedure Laterality Date  . CARDIAC CATHETERIZATION N/A 08/17/2016   Procedure: Left Heart Cath and Coronary Angiography;  Surgeon: Nelva Bush, MD;  Location: [facility] [facility];  Service: Cardiovascular;  Laterality: N/A;  . CARDIAC CATHETERIZATION N/A 08/17/2016   Procedure: Coronary Stent Intervention;  Surgeon: Nelva Bush, MD;  Location: [facility];  Service: Cardiovascular;  Laterality: N/A;  . CARDIAC CATHETERIZATION N/A 08/17/2016   Procedure: Intravascular Ultrasound/IVUS;  Surgeon: Nelva Bush, MD;  Location: [facility];  Service: Cardiovascular;  Laterality: N/A;  . DILATION AND CURETTAGE OF UTERUS    . SHOULDER SURGERY     bone spur left shoulder    . WISDOM TOOTH EXTRACTION      Current Outpatient Medications  Medication Sig Dispense Refill  . ACCU-CHEK AVIVA PLUS test strip     . aspirin 81 MG tablet Take 81 mg by mouth daily.      Marland Kitchen atorvastatin (LIPITOR) 80 MG tablet Take 1 tablet (80 mg total) by mouth daily at 6 PM. 30 tablet 5  . B-D ULTRAFINE III SHORT PEN 31G X 8 MM MISC     . BD INSULIN SYRINGE ULTRAFINE 31G X 5/16" 0.3 ML MISC     . Ferrous Sulfate (IRON) 325 (65 FE) MG TABS Take 1 tablet by mouth every Monday, Wednesday, and Friday.     . Insulin Glargine (BASAGLAR KWIKPEN) 100 UNIT/ML SOPN Inject into the skin daily.    . insulin lispro (HUMALOG) 100 UNIT/ML injection Inject 8-15 Units into the skin 3 (three) times daily before meals. Per sliding scale    . lisinopril (PRINIVIL,ZESTRIL) 10 MG tablet Take 10 mg by mouth daily.      . metoprolol tartrate (LOPRESSOR) 25 MG tablet TAKE ONE-HALF TABLET BY MOUTH TWICE A DAY 90 tablet 0  . Multiple Vitamin (MULTIVITAMIN) tablet Take 1 tablet by mouth every Monday, Wednesday, and Friday.     . nitroGLYCERIN (NITROSTAT) 0.4 MG SL tablet PLACE ONE TABLET UNDER THE TONGUE EVERY FIVE MINUTES AS NEEDED FOR CHEST PAIN 25 tablet 3  . Omega-3 Fatty Acids (FISH OIL PO) Take 1 capsule by mouth See admin  instructions. Once to twice daily    . omeprazole (PRILOSEC) 40 MG capsule TAKE ONE CAPSULE BY MOUTH DAILY 30 capsule 2  . polycarbophil (FIBERCON) 625 MG tablet Take 625 mg by mouth daily.     No current facility-administered medications for this visit.   Allergies:  Nickel   Social History: The patient  reports that she has never smoked. She has never used smokeless tobacco. She reports that she does not drink alcohol or use drugs.   ROS:  Please see the history of present illness. Otherwise, complete review of systems is positive for none.  All other systems are reviewed and negative.   Physical Exam: VS:  BP (!) 130/57   Pulse 72   Temp (!) 96.8 F (36 C)   Ht 5\' 8"  (1.727 m)    Wt 175 lb 12.8 oz (79.7 kg)   SpO2 99%   BMI 26.73 kg/m , BMI Body mass index is 26.73 kg/m.  Wt Readings from Last 3 Encounters:  01/23/20 175 lb 12.8 oz (79.7 kg)  07/27/19 168 lb (76.2 kg)  07/08/18 160 lb (72.6 kg)    General: Patient appears comfortable at rest. HEENT: Conjunctiva and lids normal, wearing a mask. Neck: Supple, no elevated JVP or carotid bruits, no thyromegaly. Lungs: Clear to auscultation, nonlabored breathing at rest. Cardiac: Regular rate and rhythm, no S3 or significant systolic murmur. Abdomen: Soft, nontender, bowel sounds present. Extremities: No pitting edema, distal pulses 2+. Skin: Warm and dry. Musculoskeletal: No kyphosis. Neuropsychiatric: Alert and oriented x3, affect grossly appropriate.  ECG:  An ECG dated 07/08/2018 was personally reviewed today and demonstrated:  Sinus bradycardia.  Recent Labwork:  January 2020: Hemoglobin A1c 7.7%, BUN 23, creatinine 0.9, potassium 4.6, AST 29, ALT 35, cholesterol 139, triglycerides 41, HDL 75, LDL 56, TSH 2.02  Other Studies Reviewed Today:  Cardiac catheterization and PCI 08/17/2016: 1. Severe single-vessel coronary artery disease with 99% stenosis of the proximal LAD. 2. Successful IVUS-guided PCI to the proximal LAD with placement of an Integrity Resolute 3.5 x 15 mm drug-eluting stent with 0% residual stenosis and TIMI-3 flow. 3. Upper normal left ventricular filling pressure.  Echocardiogram7/18/2018: Study Conclusions  - Left ventricle: The cavity size was normal. Wall thickness was normal. Systolic function was normal. The estimated ejection fraction was 55%. Features are consistent with a pseudonormal left ventricular filling pattern, with concomitant abnormal relaxation and increased filling pressure (grade 2 diastolic dysfunction). Doppler parameters are consistent with high ventricular filling pressure. - Regional wall motion abnormality: Mild hypokinesis of the  apical anterior and apical myocardium. - Aortic valve: Mildly calcified annulus. Trileaflet. There was trivial regurgitation. - Tricuspid valve: There was mild regurgitation.  Assessment and Plan:  1.  CAD status post DES to the proximal LAD in August 2017.  She reports no angina or nitroglycerin use, walking regularly for exercise.  Continue medical therapy and observation, currently on aspirin, Lipitor, lisinopril, and Lopressor.  ECG reviewed.  2.  Mixed hyperlipidemia, remains on high-dose Lipitor.  She is due for follow-up lab work with PCP.  3.  Essential hypertension, systolic is [id number] today.  No changes made to present regimen.  4.  History of ischemic cardiomyopathy with normalization of LVEF.  Medication Adjustments/Labs and Tests Ordered: Current medicines are reviewed at length with the patient today.  Concerns regarding medicines are outlined above.   Tests Ordered: Orders Placed This Encounter  Procedures  . EKG 12-Lead    Medication Changes: No orders of the defined types  were placed in this encounter.   Disposition:  Follow up 6 months in the [place] office.  Signed, Satira Sark, MD, [facility] 01/23/2020 1:59 PM    [facility] [address], [place], [place] [address] Phone: [phone]; Fax: [phone]

## 2020-01-23 NOTE — Patient Instructions (Signed)
Medication Instructions:  Your physician recommends that you continue on your current medications as directed. Please refer to the Current Medication list given to you today.  *If you need a refill on your cardiac medications before your next appointment, please call your pharmacy*  Lab Work: NONE If you have labs (blood work) drawn today and your tests are completely normal, you will receive your results only by: . MyChart Message (if you have MyChart) OR . A paper copy in the mail If you have any lab test that is abnormal or we need to change your treatment, we will call you to review the results.  Testing/Procedures: NONE  Follow-Up: At [facility], you and your health needs are our priority.  As part of our continuing mission to provide you with exceptional heart care, we have created designated Provider Care Teams.  These Care Teams include your primary Cardiologist (physician) and Advanced Practice Providers (APPs -  Physician Assistants and Nurse Practitioners) who all work together to provide you with the care you need, when you need it.  Your next appointment:   6 month(s)  The format for your next appointment:   In Person  Provider:   Samuel McDowell, MD  Other Instructions NONE      Thank you for choosing [facility] !         

## 2020-01-29 DIAGNOSIS — I110 Hypertensive heart disease with heart failure: Secondary | ICD-10-CM | POA: Diagnosis not present

## 2020-01-29 DIAGNOSIS — M859 Disorder of bone density and structure, unspecified: Secondary | ICD-10-CM | POA: Diagnosis not present

## 2020-01-29 DIAGNOSIS — E109 Type 1 diabetes mellitus without complications: Secondary | ICD-10-CM | POA: Diagnosis not present

## 2020-01-29 DIAGNOSIS — R82998 Other abnormal findings in urine: Secondary | ICD-10-CM | POA: Diagnosis not present

## 2020-01-29 DIAGNOSIS — E7849 Other hyperlipidemia: Secondary | ICD-10-CM | POA: Diagnosis not present

## 2020-01-29 DIAGNOSIS — Z0000 Encounter for general adult medical examination without abnormal findings: Secondary | ICD-10-CM | POA: Diagnosis not present

## 2020-02-05 DIAGNOSIS — E162 Hypoglycemia, unspecified: Secondary | ICD-10-CM | POA: Diagnosis not present

## 2020-02-05 DIAGNOSIS — Z0000 Encounter for general adult medical examination without abnormal findings: Secondary | ICD-10-CM | POA: Diagnosis not present

## 2020-02-05 DIAGNOSIS — I110 Hypertensive heart disease with heart failure: Secondary | ICD-10-CM | POA: Diagnosis not present

## 2020-02-05 DIAGNOSIS — I2510 Atherosclerotic heart disease of native coronary artery without angina pectoris: Secondary | ICD-10-CM | POA: Diagnosis not present

## 2020-02-05 DIAGNOSIS — E109 Type 1 diabetes mellitus without complications: Secondary | ICD-10-CM | POA: Diagnosis not present

## 2020-02-05 DIAGNOSIS — Z1212 Encounter for screening for malignant neoplasm of rectum: Secondary | ICD-10-CM | POA: Diagnosis not present

## 2020-08-02 ENCOUNTER — Encounter: Payer: Self-pay | Admitting: Cardiology

## 2020-08-26 NOTE — Progress Notes (Deleted)
Cardiology Office Note  Date: 08/26/2020   ID: [name], [name] 26-May-1956, MRN [id number]  PCP:  Shon Baton, MD  Cardiologist:  Rozann Lesches, MD Electrophysiologist:  None   No chief complaint on file.   History of Present Illness: Rhonda Allen is a 64 y.o. female last seen in January.  We discussed her exercise plan.  Past Medical History:  Diagnosis Date  . Anemia   . CAD (coronary artery disease)    DES to proximal LAD August 2017  . Diverticulosis of colon   . Essential hypertension   . Frozen shoulder    Right  . GERD (gastroesophageal reflux disease)   . Hemorrhoids   . NSTEMI (non-ST elevated myocardial infarction) ([place]) 2017  . Type 2 diabetes mellitus ([place])     Past Surgical History:  Procedure Laterality Date  . CARDIAC CATHETERIZATION N/A 08/17/2016   Procedure: Left Heart Cath and Coronary Angiography;  Surgeon: Nelva Bush, MD;  Location: [facility] [facility];  Service: Cardiovascular;  Laterality: N/A;  . CARDIAC CATHETERIZATION N/A 08/17/2016   Procedure: Coronary Stent Intervention;  Surgeon: Nelva Bush, MD;  Location: [facility];  Service: Cardiovascular;  Laterality: N/A;  . CARDIAC CATHETERIZATION N/A 08/17/2016   Procedure: Intravascular Ultrasound/IVUS;  Surgeon: Nelva Bush, MD;  Location: [place] [facility];  Service: Cardiovascular;  Laterality: N/A;  . DILATION AND CURETTAGE OF UTERUS    . SHOULDER SURGERY     bone spur left shoulder  . WISDOM TOOTH EXTRACTION      Current Outpatient Medications  Medication Sig Dispense Refill  . ACCU-CHEK AVIVA PLUS test strip     . aspirin 81 MG tablet Take 81 mg by mouth daily.      Marland Kitchen atorvastatin (LIPITOR) 80 MG tablet Take 1 tablet (80 mg total) by mouth daily at 6 PM. 30 tablet 5  . B-D ULTRAFINE III SHORT PEN 31G X 8 MM MISC     . BD INSULIN SYRINGE ULTRAFINE 31G X 5/16" 0.3 ML MISC     . Ferrous Sulfate (IRON) 325 (65 FE) MG TABS Take 1 tablet by mouth every  Monday, Wednesday, and Friday.     . Insulin Glargine (BASAGLAR KWIKPEN) 100 UNIT/ML SOPN Inject into the skin daily.    . insulin lispro (HUMALOG) 100 UNIT/ML injection Inject 8-15 Units into the skin 3 (three) times daily before meals. Per sliding scale    . lisinopril (PRINIVIL,ZESTRIL) 10 MG tablet Take 10 mg by mouth daily.      . metoprolol tartrate (LOPRESSOR) 25 MG tablet TAKE ONE-HALF TABLET BY MOUTH TWICE A DAY 90 tablet 0  . Multiple Vitamin (MULTIVITAMIN) tablet Take 1 tablet by mouth every Monday, Wednesday, and Friday.     . nitroGLYCERIN (NITROSTAT) 0.4 MG SL tablet PLACE ONE TABLET UNDER THE TONGUE EVERY FIVE MINUTES AS NEEDED FOR CHEST PAIN 25 tablet 3  . Omega-3 Fatty Acids (FISH OIL PO) Take 1 capsule by mouth See admin instructions. Once to twice daily    . omeprazole (PRILOSEC) 40 MG capsule TAKE ONE CAPSULE BY MOUTH DAILY 30 capsule 2  . polycarbophil (FIBERCON) 625 MG tablet Take 625 mg by mouth daily.     No current facility-administered medications for this visit.   Allergies:  Nickel   Social History: The patient  reports that she has never smoked. She has never used smokeless tobacco. She reports that she does not drink alcohol and does not use drugs.   Family  History: The patient's family history includes Cancer in her father; Hypertension in her mother; Irritable bowel syndrome in her mother; Ulcers in her sister.   ROS:  Please see the history of present illness. Otherwise, complete review of systems is positive for {NONE DEFAULTED:18576::"none"}.  All other systems are reviewed and negative.   Physical Exam: VS:  There were no vitals taken for this visit., BMI There is no height or weight on file to calculate BMI.  Wt Readings from Last 3 Encounters:  01/23/20 175 lb 12.8 oz (79.7 kg)  07/27/19 168 lb (76.2 kg)  07/08/18 160 lb (72.6 kg)    General: Patient appears comfortable at rest. HEENT: Conjunctiva and lids normal, oropharynx clear with moist  mucosa. Neck: Supple, no elevated JVP or carotid bruits, no thyromegaly. Lungs: Clear to auscultation, nonlabored breathing at rest. Cardiac: Regular rate and rhythm, no S3 or significant systolic murmur, no pericardial rub. Abdomen: Soft, nontender, no hepatomegaly, bowel sounds present, no guarding or rebound. Extremities: No pitting edema, distal pulses 2+. Skin: Warm and dry. Musculoskeletal: No kyphosis. Neuropsychiatric: Alert and oriented x3, affect grossly appropriate.  ECG:  An ECG dated 01/23/2020 was personally reviewed today and demonstrated:  Normal sinus rhythm.  Recent Labwork:  No interval lab work for review today.  Other Studies Reviewed Today:  Cardiac catheterization and PCI 08/17/2016: 1. Severe single-vessel coronary artery disease with 99% stenosis of the proximal LAD. 2. Successful IVUS-guided PCI to the proximal LAD with placement of an Integrity Resolute 3.5 x 15 mm drug-eluting stent with 0% residual stenosis and TIMI-3 flow. 3. Upper normal left ventricular filling pressure.  Echocardiogram7/18/2018: Study Conclusions  - Left ventricle: The cavity size was normal. Wall thickness was normal. Systolic function was normal. The estimated ejection fraction was 55%. Features are consistent with a pseudonormal left ventricular filling pattern, with concomitant abnormal relaxation and increased filling pressure (grade 2 diastolic dysfunction). Doppler parameters are consistent with high ventricular filling pressure. - Regional wall motion abnormality: Mild hypokinesis of the apical anterior and apical myocardium. - Aortic valve: Mildly calcified annulus. Trileaflet. There was trivial regurgitation. - Tricuspid valve: There was mild regurgitation.  Assessment and Plan:    Medication Adjustments/Labs and Tests Ordered: Current medicines are reviewed at length with the patient today.  Concerns regarding medicines are outlined above.    Tests Ordered: No orders of the defined types were placed in this encounter.   Medication Changes: No orders of the defined types were placed in this encounter.   Disposition:  Follow up {follow up:15908}  Signed, Satira Sark, MD, [facility] 08/26/2020 10:05 AM    [facility] [place], [place], [place] [address] Phone: [phone]; Fax: [phone]

## 2020-08-27 ENCOUNTER — Ambulatory Visit: Payer: Federal, State, Local not specified - PPO | Admitting: Cardiology

## 2020-12-29 NOTE — Progress Notes (Signed)
Cardiology Office Note  Date: 12/30/2020   ID: Rhonda, Allen 05/06/1956, MRN [id number]  PCP:  Creola Corn, MD  Cardiologist:  Nona Dell, MD Electrophysiologist:  None   Chief Complaint  Patient presents with  . Cardiac follow-up    History of Present Illness: Rhonda Allen is a 65 y.o. female last seen in January 2021.  She presents for a routine visit.  States that she has been doing well, no angina symptoms or nitroglycerin use.  She walks with a friend, four days a week for about an hour, tries to get 10,000 steps.  I reviewed her medications which are stable from a cardiac perspective, she reports no obvious intolerances.  She has a physical with lab work in February with Dr. Timothy Lasso.  I personally reviewed her ECG today which shows normal sinus rhythm.  Past Medical History:  Diagnosis Date  . Anemia   . CAD (coronary artery disease)    DES to proximal LAD August 2017  . Diverticulosis of colon   . Essential hypertension   . Frozen shoulder    Right  . GERD (gastroesophageal reflux disease)   . Hemorrhoids   . NSTEMI (non-ST elevated myocardial infarction) ([facility]) 2017  . Type 2 diabetes mellitus ([facility])     Past Surgical History:  Procedure Laterality Date  . CARDIAC CATHETERIZATION N/A 08/17/2016   Procedure: Left Heart Cath and Coronary Angiography;  Surgeon: Yvonne Kendall, MD;  Location: [facility];  Service: Cardiovascular;  Laterality: N/A;  . CARDIAC CATHETERIZATION N/A 08/17/2016   Procedure: Coronary Stent Intervention;  Surgeon: Yvonne Kendall, MD;  Location: [facility];  Service: Cardiovascular;  Laterality: N/A;  . CARDIAC CATHETERIZATION N/A 08/17/2016   Procedure: Intravascular Ultrasound/IVUS;  Surgeon: Yvonne Kendall, MD;  Location: [facility];  Service: Cardiovascular;  Laterality: N/A;  . DILATION AND CURETTAGE OF UTERUS    . SHOULDER SURGERY     bone spur left shoulder  . WISDOM TOOTH EXTRACTION       Current Outpatient Medications  Medication Sig Dispense Refill  . ACCU-CHEK AVIVA PLUS test strip     . aspirin 81 MG tablet Take 81 mg by mouth daily.    Marland Kitchen atorvastatin (LIPITOR) 80 MG tablet Take 1 tablet (80 mg total) by mouth daily at 6 PM. 30 tablet 5  . B-D ULTRAFINE III SHORT PEN 31G X 8 MM MISC     . BD INSULIN SYRINGE ULTRAFINE 31G X 5/16" 0.3 ML MISC     . Ferrous Sulfate (IRON) 325 (65 FE) MG TABS Take 1 tablet by mouth every Monday, Wednesday, and Friday.     . Insulin Glargine (BASAGLAR KWIKPEN) 100 UNIT/ML SOPN Inject into the skin daily.    . insulin lispro (HUMALOG) 100 UNIT/ML injection Inject 8-15 Units into the skin 3 (three) times daily before meals. Per sliding scale    . lisinopril (PRINIVIL,ZESTRIL) 10 MG tablet Take 10 mg by mouth daily.    . metoprolol tartrate (LOPRESSOR) 25 MG tablet TAKE ONE-HALF TABLET BY MOUTH TWICE A DAY 90 tablet 0  . Multiple Vitamin (MULTIVITAMIN) tablet Take 1 tablet by mouth every Monday, Wednesday, and Friday.    . nitroGLYCERIN (NITROSTAT) 0.4 MG SL tablet PLACE ONE TABLET UNDER THE TONGUE EVERY FIVE MINUTES AS NEEDED FOR CHEST PAIN 25 tablet 3  . Omega-3 Fatty Acids (FISH OIL PO) Take 1 capsule by mouth See admin instructions. Once to twice daily    .  omeprazole (PRILOSEC) 40 MG capsule TAKE ONE CAPSULE BY MOUTH DAILY 30 capsule 2  . polycarbophil (FIBERCON) 625 MG tablet Take 625 mg by mouth daily.     No current facility-administered medications for this visit.   Allergies:  Nickel   ROS: No palpitations or syncope.  Physical Exam: VS:  BP 130/66   Pulse 60   Resp 16   Ht 5\' 8"  (1.727 m)   Wt 174 lb 6.4 oz (79.1 kg)   SpO2 96%   BMI 26.52 kg/m , BMI Body mass index is 26.52 kg/m.  Wt Readings from Last 3 Encounters:  12/30/20 174 lb 6.4 oz (79.1 kg)  01/23/20 175 lb 12.8 oz (79.7 kg)  07/27/19 168 lb (76.2 kg)    General: Patient appears comfortable at rest. HEENT: Conjunctiva and lids normal, wearing a  mask. Neck: Supple, no elevated JVP or carotid bruits, no thyromegaly. Lungs: Clear to auscultation, nonlabored breathing at rest. Cardiac: Regular rate and rhythm, no S3 or significant systolic murmur, no pericardial rub. Extremities: No pitting edema.  ECG:  An ECG dated 01/23/2020 was personally reviewed today and demonstrated:  Normal sinus rhythm.  Recent Labwork:  January 2020: Hemoglobin A1c 7.7%, BUN 23, creatinine 0.9, potassium 4.6, AST 29, ALT 35, cholesterol 139, triglycerides 41, HDL 75, LDL 56, TSH 2.02  Other Studies Reviewed Today:  Cardiac catheterization and PCI 08/17/2016: 1. Severe single-vessel coronary artery disease with 99% stenosis of the proximal LAD. 2. Successful IVUS-guided PCI to the proximal LAD with placement of an Integrity Resolute 3.5 x 15 mm drug-eluting stent with 0% residual stenosis and TIMI-3 flow. 3. Upper normal left ventricular filling pressure.  Echocardiogram7/18/2018: Study Conclusions  - Left ventricle: The cavity size was normal. Wall thickness was normal. Systolic function was normal. The estimated ejection fraction was 55%. Features are consistent with a pseudonormal left ventricular filling pattern, with concomitant abnormal relaxation and increased filling pressure (grade 2 diastolic dysfunction). Doppler parameters are consistent with high ventricular filling pressure. - Regional wall motion abnormality: Mild hypokinesis of the apical anterior and apical myocardium. - Aortic valve: Mildly calcified annulus. Trileaflet. There was trivial regurgitation. - Tricuspid valve: There was mild regurgitation.  Assessment and Plan:  1.  CAD status post DES to the proximal LAD in 2017.  She continues to do well without angina on medical therapy, walking regularly for exercise.  ECG today is normal.  Continue aspirin, Lipitor, Lopressor, lisinopril, and as needed nitroglycerin.  2.  Mixed hyperlipidemia, she remains on  Lipitor 80 mg daily.  Follow-up lab work with Dr. Virgina Jock in February as scheduled.  Goal LDL should be under 70.  3.  Essential hypertension, no changes made to present regimen including lisinopril and Lopressor.  Medication Adjustments/Labs and Tests Ordered: Current medicines are reviewed at length with the patient today.  Concerns regarding medicines are outlined above.   Tests Ordered: Orders Placed This Encounter  Procedures  . EKG 12-Lead    Medication Changes: No orders of the defined types were placed in this encounter.   Disposition:  Follow up 1 year in the [place] office.  Signed, Satira Sark, MD, [facility] 12/30/2020 11:20 AM    [facility] [place], [place], [place] [address] Phone: [phone]; Fax: [phone]

## 2020-12-30 ENCOUNTER — Encounter: Payer: Self-pay | Admitting: Cardiology

## 2020-12-30 ENCOUNTER — Ambulatory Visit (INDEPENDENT_AMBULATORY_CARE_PROVIDER_SITE_OTHER): Payer: Federal, State, Local not specified - PPO | Admitting: Cardiology

## 2020-12-30 ENCOUNTER — Other Ambulatory Visit: Payer: Self-pay

## 2020-12-30 VITALS — BP 130/66 | HR 60 | Resp 16 | Ht 68.0 in | Wt 174.4 lb

## 2020-12-30 DIAGNOSIS — E782 Mixed hyperlipidemia: Secondary | ICD-10-CM

## 2020-12-30 DIAGNOSIS — I10 Essential (primary) hypertension: Secondary | ICD-10-CM | POA: Diagnosis not present

## 2020-12-30 DIAGNOSIS — I25119 Atherosclerotic heart disease of native coronary artery with unspecified angina pectoris: Secondary | ICD-10-CM

## 2020-12-30 NOTE — Patient Instructions (Signed)
Medication Instructions:  Your physician recommends that you continue on your current medications as directed. Please refer to the Current Medication list given to you today.  *If you need a refill on your cardiac medications before your next appointment, please call your pharmacy*   Lab Work: None today If you have labs (blood work) drawn today and your tests are completely normal, you will receive your results only by: . MyChart Message (if you have MyChart) OR . A paper copy in the mail If you have any lab test that is abnormal or we need to change your treatment, we will call you to review the results.   Testing/Procedures: None today   Follow-Up: At [facility], you and your health needs are our priority.  As part of our continuing mission to provide you with exceptional heart care, we have created designated Provider Care Teams.  These Care Teams include your primary Cardiologist (physician) and Advanced Practice Providers (APPs -  Physician Assistants and Nurse Practitioners) who all work together to provide you with the care you need, when you need it.  We recommend signing up for the patient portal called "MyChart".  Sign up information is provided on this After Visit Summary.  MyChart is used to connect with patients for Virtual Visits (Telemedicine).  Patients are able to view lab/test results, encounter notes, upcoming appointments, etc.  Non-urgent messages can be sent to your provider as well.   To learn more about what you can do with MyChart, go to [email or web address].    Your next appointment:   12 month(s)  The format for your next appointment:   In Person  Provider:   Samuel McDowell, MD   Other Instructions None       Thank you for choosing [facility] !         

## 2021-08-25 ENCOUNTER — Encounter: Payer: Self-pay | Admitting: Internal Medicine

## 2021-10-01 ENCOUNTER — Encounter: Payer: Self-pay | Admitting: Internal Medicine

## 2021-10-01 ENCOUNTER — Ambulatory Visit: Payer: Medicare Other | Admitting: Internal Medicine

## 2021-10-01 VITALS — BP 100/70 | HR 75 | Ht 68.0 in | Wt 173.0 lb

## 2021-10-01 DIAGNOSIS — E119 Type 2 diabetes mellitus without complications: Secondary | ICD-10-CM | POA: Diagnosis not present

## 2021-10-01 DIAGNOSIS — Z1211 Encounter for screening for malignant neoplasm of colon: Secondary | ICD-10-CM

## 2021-10-01 DIAGNOSIS — R1310 Dysphagia, unspecified: Secondary | ICD-10-CM

## 2021-10-01 DIAGNOSIS — K219 Gastro-esophageal reflux disease without esophagitis: Secondary | ICD-10-CM

## 2021-10-01 DIAGNOSIS — Z794 Long term (current) use of insulin: Secondary | ICD-10-CM

## 2021-10-01 MED ORDER — PLENVU 140 G PO SOLR: 1.0000 | Freq: Once | ORAL | 0 refills | Status: AC

## 2021-10-01 NOTE — Progress Notes (Signed)
HISTORY OF PRESENT ILLNESS:  Rhonda Allen is a 65 y.o. female with past medical history as listed below who presents today regarding screening colonoscopy, chronic GERD, and new onset intermittent solid food dysphagia.  The patient underwent colonoscopy in 2005 and again in 2012 for routine colon cancer screening.  She was found to have moderate sigmoid diverticulosis and internal hemorrhoids.  No neoplasia.  Follow-up in 10 years recommended.  Currently, she has no lower GI complaints.  Next, she has chronic GERD that requires PPI to control symptoms.  Currently on omeprazole 40 mg daily.  Had inadequate control with omeprazole 20 mg daily.  Last seen in the office December 2015.  She was asked to follow-up in 1 year, but follows up at this time.  Patient tells me that over the past year she has developed some intermittent solid food dysphagia.  Her husband apparently has had significant problems with the same for which he has required esophageal dilation.  The patient did undergo upper endoscopy in September 2005.  She was found to have mild esophagitis, esophageal stricture, and hiatal hernia.  No dysphagia at that time, thus no dilation.  She denies weight loss.  Her general medical problems are stable including diabetes for which she takes insulin.  No outside laboratories or x-rays available for review.  She did have COVID in July 2022.  No residual effects.  REVIEW OF SYSTEMS:  All non-GI ROS negative unless otherwise stated in the HPI.  Past Medical History:  Diagnosis Date   Anemia    CAD (coronary artery disease)    DES to proximal LAD August 2017   Diverticulosis of colon    Essential hypertension    Frozen shoulder    Right   GERD (gastroesophageal reflux disease)    Hemorrhoids    NSTEMI (non-ST elevated myocardial infarction) ([place]) 2017   Type 2 diabetes mellitus ([place])     Past Surgical History:  Procedure Laterality Date   CARDIAC CATHETERIZATION N/A 08/17/2016    Procedure: Left Heart Cath and Coronary Angiography;  Surgeon: Nelva Bush, MD;  Location: [facility];  Service: Cardiovascular;  Laterality: N/A;   CARDIAC CATHETERIZATION N/A 08/17/2016   Procedure: Coronary Stent Intervention;  Surgeon: Nelva Bush, MD;  Location: [facility];  Service: Cardiovascular;  Laterality: N/A;   CARDIAC CATHETERIZATION N/A 08/17/2016   Procedure: Intravascular Ultrasound/IVUS;  Surgeon: Nelva Bush, MD;  Location: [facility];  Service: Cardiovascular;  Laterality: N/A;   DILATION AND CURETTAGE OF UTERUS     SHOULDER SURGERY     bone spur left shoulder   WISDOM TOOTH EXTRACTION      Social History Rhonda Allen  reports that she has never smoked. She has never used smokeless tobacco. She reports that she does not drink alcohol and does not use drugs.  family history includes Cancer in her father; Hypertension in her mother; Irritable bowel syndrome in her mother; Ulcers in her sister.  Allergies  Allergen Reactions   Nickel Rash       PHYSICAL EXAMINATION: Vital signs: BP 100/70   Pulse 75   Ht 5\' 8"  (1.727 m)   Wt 173 lb (78.5 kg)   BMI 26.30 kg/m   Constitutional: generally well-appearing, no acute distress Psychiatric: alert and oriented x3, cooperative Eyes: extraocular movements intact, anicteric, conjunctiva pink Mouth: oral pharynx moist, no lesions Neck: supple no lymphadenopathy Cardiovascular: heart regular rate and rhythm, no murmur Lungs: clear to auscultation bilaterally Abdomen: soft,  nontender, nondistended, no obvious ascites, no peritoneal signs, normal bowel sounds, no organomegaly Rectal: Deferred until colonoscopy Extremities: no clubbing, cyanosis, or lower extremity edema bilaterally Skin: no lesions on visible extremities Neuro: No focal deficits.  Cranial nerves intact  ASSESSMENT:  1.  Chronic GERD.  Requires PPI to control symptoms. 2.  Intermittent solid food dysphagia.  Known  stricture on remote endoscopy.  Suspect peptic stricture. 3.  Screening colonoscopy.  Baseline risk for neoplasia.  Last examination 2012 was negative for neoplasia 4.  Multiple general medical problems, including insulin requiring diabetes.  Stable.  PLAN:  1.  Reflux precautions 2.  Continue omeprazole 40 mg daily.  Medication risks reviewed 3.  Schedule upper endoscopy with probable esophageal dilation.  The patient is higher than baseline risk due to her comorbidities and the need to address her diabetic therapy for her procedure.The nature of the procedure, as well as the risks, benefits, and alternatives were carefully and thoroughly reviewed with the patient. Ample time for discussion and questions allowed. The patient understood, was satisfied, and agreed to proceed.  4.  Schedule screening colonoscopy.  Higher than baseline risk as above.The nature of the procedure, as well as the risks, benefits, and alternatives were carefully and thoroughly reviewed with the patient. Ample time for discussion and questions allowed. The patient understood, was satisfied, and agreed to proceed.

## 2021-10-01 NOTE — Patient Instructions (Signed)
If you are age 65 or older, your body mass index should be between 23-30. Your Body mass index is 26.3 kg/m. If this is out of the aforementioned range listed, please consider follow up with your Primary Care Provider.  If you are age 8 or younger, your body mass index should be between 19-25. Your Body mass index is 26.3 kg/m. If this is out of the aformentioned range listed, please consider follow up with your Primary Care Provider.   __________________________________________________________  The [place] [facility] providers would like to encourage you to use [facility] to communicate with providers for non-urgent requests or questions.  Due to long hold times on the telephone, sending your provider a message by [facility] may be a faster and more efficient way to get a response.  Please allow 48 business hours for a response.  Please remember that this is for non-urgent requests.   You have been scheduled for an endoscopy and colonoscopy. Please follow the written instructions given to you at your visit today. Please pick up your prep supplies at the pharmacy within the next 1-3 days. If you use inhalers (even only as needed), please bring them with you on the day of your procedure.

## 2021-11-10 ENCOUNTER — Encounter: Payer: Self-pay | Admitting: Internal Medicine

## 2021-12-15 ENCOUNTER — Encounter: Payer: Federal, State, Local not specified - PPO | Admitting: Internal Medicine

## 2021-12-16 ENCOUNTER — Other Ambulatory Visit: Payer: Self-pay

## 2021-12-16 ENCOUNTER — Encounter: Payer: Self-pay | Admitting: Internal Medicine

## 2021-12-16 ENCOUNTER — Ambulatory Visit (AMBULATORY_SURGERY_CENTER): Payer: Medicare Other | Admitting: Internal Medicine

## 2021-12-16 VITALS — BP 115/58 | HR 66 | Temp 97.1°F | Resp 13 | Ht 68.0 in | Wt 173.0 lb

## 2021-12-16 DIAGNOSIS — K219 Gastro-esophageal reflux disease without esophagitis: Secondary | ICD-10-CM | POA: Diagnosis not present

## 2021-12-16 DIAGNOSIS — K222 Esophageal obstruction: Secondary | ICD-10-CM | POA: Diagnosis not present

## 2021-12-16 DIAGNOSIS — R1310 Dysphagia, unspecified: Secondary | ICD-10-CM | POA: Diagnosis not present

## 2021-12-16 DIAGNOSIS — Z1211 Encounter for screening for malignant neoplasm of colon: Secondary | ICD-10-CM | POA: Diagnosis not present

## 2021-12-16 MED ORDER — SODIUM CHLORIDE 0.9 % IV SOLN: 500.0000 mL | Freq: Once | INTRAVENOUS | Status: DC

## 2021-12-16 NOTE — Progress Notes (Signed)
Pt's states no medical or surgical changes since previsit or office visit. 

## 2021-12-16 NOTE — Progress Notes (Signed)
Called to room to assist during endoscopic procedure.  Patient ID and intended procedure confirmed with present staff. Received instructions for my participation in the procedure from the performing physician.  

## 2021-12-16 NOTE — Progress Notes (Signed)
A and O x3. Report to RN. Tolerated MAC anesthesia well. Teeth unchanged after procedure.

## 2021-12-16 NOTE — Op Note (Signed)
[place] Patient Name: Rhonda Allen Procedure Date: 12/16/2021 7:58 AM MRN: [id number] Endoscopist: Docia Chuck. Henrene Pastor , MD Age: 65 Referring MD:  Date of Birth: [birth year] Gender: Female Account #: [id number] Procedure:                Upper GI endoscopy with [facility] dilation of the                            esophagus. 69 [place] Indications:              Dysphagia, Therapeutic procedure, Esophageal reflux Medicines:                Monitored Anesthesia Care Procedure:                Pre-Anesthesia Assessment:                           - Prior to the procedure, a History and Physical                            was performed, and patient medications and                            allergies were reviewed. The patient's tolerance of                            previous anesthesia was also reviewed. The risks                            and benefits of the procedure and the sedation                            options and risks were discussed with the patient.                            All questions were answered, and informed consent                            was obtained. Prior Anticoagulants: The patient has                            taken no previous anticoagulant or antiplatelet                            agents. ASA Grade Assessment: II - A patient with                            mild systemic disease. After reviewing the risks                            and benefits, the patient was deemed in                            satisfactory condition to undergo the procedure.  After obtaining informed consent, the endoscope was                            passed under direct vision. Throughout the                            procedure, the patient's blood pressure, pulse, and                            oxygen saturations were monitored continuously. The                            GIF HQ190 #[id number] was introduced through the                             mouth, and advanced to the second part of duodenum.                            The upper GI endoscopy was accomplished without                            difficulty. The patient tolerated the procedure                            well. Scope In: Scope Out: Findings:                 One benign-appearing, intrinsic moderate stenosis                            was found 35 cm from the incisors. This stenosis                            measured 1.5 cm (inner diameter). After completing                            the entire endoscopic survey, the scope was                            withdrawn. Dilation was then performed with a                            Maloney dilator with no resistance at 24 Fr.Marland Kitchen No                            heme                           The exam of the esophagus was otherwise normal.                           The stomach revealed a moderate sliding hiatal                            hernia and a few small  benign fundic gland type                            polyps.                           The examined duodenum was normal.                           The cardia and gastric fundus were normal on                            retroflexion. Complications:            No immediate complications. Estimated Blood Loss:     Estimated blood loss: none. Impression:               1. GERD complicated by peptic stricture                           2. Peptic stricture status post esophageal dilation                           3. Otherwise unremarkable EGD as described above. Recommendation:           1. Patient has a contact number available for                            emergencies. The signs and symptoms of potential                            delayed complications were discussed with the                            patient. Return to normal activities tomorrow.                            Written discharge instructions were provided to the                            patient.                            2. Post dilation diet.                           3. Continue present medications.                           4. Routine office follow-up with Dr. Henrene Pastor in 1 year Docia Chuck. Henrene Pastor, MD 12/16/2021 9:13:10 AM This report has been signed electronically.

## 2021-12-16 NOTE — Op Note (Signed)
[place] Patient Name: Rhonda Allen Procedure Date: 12/16/2021 7:59 AM MRN: [id number] Endoscopist: Docia Chuck. Henrene Pastor , MD Age: 65 Referring MD:  Date of Birth: 10/04/1956 Gender: Female Account #: [id number] Procedure:                Colonoscopy Indications:              Screening for colorectal malignant neoplasm.                            Negative index exam 2005 and follow-up exam 2012 Medicines:                Monitored Anesthesia Care Procedure:                Pre-Anesthesia Assessment:                           - Prior to the procedure, a History and Physical                            was performed, and patient medications and                            allergies were reviewed. The patient's tolerance of                            previous anesthesia was also reviewed. The risks                            and benefits of the procedure and the sedation                            options and risks were discussed with the patient.                            All questions were answered, and informed consent                            was obtained. Prior Anticoagulants: The patient has                            taken no previous anticoagulant or antiplatelet                            agents. ASA Grade Assessment: II - A patient with                            mild systemic disease. After reviewing the risks                            and benefits, the patient was deemed in                            satisfactory condition to undergo the procedure.  After obtaining informed consent, the colonoscope                            was passed under direct vision. Throughout the                            procedure, the patient's blood pressure, pulse, and                            oxygen saturations were monitored continuously. The                            Olympus CF-HQ190L [id number]) Colonoscope was                            introduced  through the anus and advanced to the the                            cecum, identified by appendiceal orifice and                            ileocecal valve. The ileocecal valve, appendiceal                            orifice, and rectum were photographed. The quality                            of the bowel preparation was excellent. The                            colonoscopy was performed without difficulty. The                            patient tolerated the procedure well. The bowel                            preparation used was SUPREP via split dose                            instruction. Scope In: 8:41:09 AM Scope Out: 8:52:57 AM Scope Withdrawal Time: 0 hours 8 minutes 50 seconds  Total Procedure Duration: 0 hours 11 minutes 48 seconds  Findings:                 Multiple diverticula were found in the sigmoid                            colon.                           Internal hemorrhoids were found during retroflexion.                           The exam was otherwise without abnormality on  direct and retroflexion views. Complications:            No immediate complications. Estimated blood loss:                            None. Estimated Blood Loss:     Estimated blood loss: none. Impression:               - Diverticulosis in the sigmoid colon.                           - Internal hemorrhoids.                           - The examination was otherwise normal on direct                            and retroflexion views.                           - No specimens collected. Recommendation:           - Repeat colonoscopy in 10 years for screening                            purposes.                           - Patient has a contact number available for                            emergencies. The signs and symptoms of potential                            delayed complications were discussed with the                            patient. Return to normal  activities tomorrow.                            Written discharge instructions were provided to the                            patient.                           - Resume previous diet.                           - Continue present medications. Docia Chuck. Henrene Pastor, MD 12/16/2021 9:07:12 AM This report has been signed electronically.

## 2021-12-16 NOTE — Progress Notes (Signed)
C.W. vital signs. 

## 2021-12-16 NOTE — Patient Instructions (Signed)
YOU HAD AN ENDOSCOPIC PROCEDURE TODAY AT [facility]:   Refer to the procedure report that was given to you for any specific questions about what was found during the examination.  If the procedure report does not answer your questions, please call your gastroenterologist to clarify.  If you requested that your care partner not be given the details of your procedure findings, then the procedure report has been included in a sealed envelope for you to review at your convenience later.  **Handouts given on esophageal stricture, dilation diet, hemorrhoids and diverticulosis**  YOU SHOULD EXPECT: Some feelings of bloating in the abdomen. Passage of more gas than usual.  Walking can help get rid of the air that was put into your GI tract during the procedure and reduce the bloating. If you had a lower endoscopy (such as a colonoscopy or flexible sigmoidoscopy) you may notice spotting of blood in your stool or on the toilet paper. If you underwent a bowel prep for your procedure, you may not have a normal bowel movement for a few days.  Please Note:  You might notice some irritation and congestion in your nose or some drainage.  This is from the oxygen used during your procedure.  There is no need for concern and it should clear up in a day or so.  SYMPTOMS TO REPORT IMMEDIATELY:  Following lower endoscopy (colonoscopy or flexible sigmoidoscopy):  Excessive amounts of blood in the stool  Significant tenderness or worsening of abdominal pains  Swelling of the abdomen that is new, acute  Fever of 100F or higher  Following upper endoscopy (EGD)  Vomiting of blood or coffee ground material  New chest pain or pain under the shoulder blades  Painful or persistently difficult swallowing  New shortness of breath  Fever of 100F or higher  Black, tarry-looking stools  For urgent or emergent issues, a gastroenterologist can be reached at any hour by calling [phone]. Do not use  MyChart messaging for urgent concerns.    DIET:  We do recommend a small meal at first, but then you may proceed to your regular diet.  Drink plenty of fluids but you should avoid alcoholic beverages for 24 hours.  ACTIVITY:  You should plan to take it easy for the rest of today and you should NOT DRIVE or use heavy machinery until tomorrow (because of the sedation medicines used during the test).    FOLLOW UP: Our staff will call the number listed on your records 48-72 hours following your procedure to check on you and address any questions or concerns that you may have regarding the information given to you following your procedure. If we do not reach you, we will leave a message.  We will attempt to reach you two times.  During this call, we will ask if you have developed any symptoms of COVID 19. If you develop any symptoms (ie: fever, flu-like symptoms, shortness of breath, cough etc.) before then, please call [phone].  If you test positive for Covid 19 in the 2 weeks post procedure, please call and report this information to [place].    If any biopsies were taken you will be contacted by phone or by letter within the next 1-3 weeks.  Please call us at [phone] if you have not heard about the biopsies in 3 weeks.    SIGNATURES/CONFIDENTIALITY: You and/or your care partner have signed paperwork which will be entered into your electronic medical record.  These signatures attest  to the fact that that the information above on your After Visit Summary has been reviewed and is understood.  Full responsibility of the confidentiality of this discharge information lies with you and/or your care-partner.

## 2021-12-16 NOTE — Progress Notes (Signed)
HISTORY OF PRESENT ILLNESS:   Rhonda Allen is a 65 y.o. female with past medical history as listed below who presents today regarding screening colonoscopy, chronic GERD, and new onset intermittent solid food dysphagia.  The patient underwent colonoscopy in 2005 and again in 2012 for routine colon cancer screening.  She was found to have moderate sigmoid diverticulosis and internal hemorrhoids.  No neoplasia.  Follow-up in 10 years recommended.  Currently, she has no lower GI complaints.  Next, she has chronic GERD that requires PPI to control symptoms.  Currently on omeprazole 40 mg daily.  Had inadequate control with omeprazole 20 mg daily.  Last seen in the office December 2015.  She was asked to follow-up in 1 year, but follows up at this time.  Patient tells me that over the past year she has developed some intermittent solid food dysphagia.  Her husband apparently has had significant problems with the same for which he has required esophageal dilation.  The patient did undergo upper endoscopy in September 2005.  She was found to have mild esophagitis, esophageal stricture, and hiatal hernia.  No dysphagia at that time, thus no dilation.  She denies weight loss.  Her general medical problems are stable including diabetes for which she takes insulin.  No outside laboratories or x-rays available for review.  She did have COVID in July 2022.  No residual effects.   REVIEW OF SYSTEMS:   All non-GI ROS negative unless otherwise stated in the HPI.       Past Medical History:  Diagnosis Date   Anemia     CAD (coronary artery disease)      DES to proximal LAD August 2017   Diverticulosis of colon     Essential hypertension     Frozen shoulder      Right   GERD (gastroesophageal reflux disease)     Hemorrhoids     NSTEMI (non-ST elevated myocardial infarction) ([place]) 2017   Type 2 diabetes mellitus ([place])             Past Surgical History:  Procedure Laterality Date   CARDIAC  CATHETERIZATION N/A 08/17/2016    Procedure: Left Heart Cath and Coronary Angiography;  Surgeon: Nelva Bush, MD;  Location: [facility];  Service: Cardiovascular;  Laterality: N/A;   CARDIAC CATHETERIZATION N/A 08/17/2016    Procedure: Coronary Stent Intervention;  Surgeon: Nelva Bush, MD;  Location: [place] [facility];  Service: Cardiovascular;  Laterality: N/A;   CARDIAC CATHETERIZATION N/A 08/17/2016    Procedure: Intravascular Ultrasound/IVUS;  Surgeon: Nelva Bush, MD;  Location: [place] [facility];  Service: Cardiovascular;  Laterality: N/A;   DILATION AND CURETTAGE OF UTERUS       SHOULDER SURGERY        bone spur left shoulder   WISDOM TOOTH EXTRACTION          Social History Rhonda Allen  reports that she has never smoked. She has never used smokeless tobacco. She reports that she does not drink alcohol and does not use drugs.   family history includes Cancer in her father; Hypertension in her mother; Irritable bowel syndrome in her mother; Ulcers in her sister.       Allergies  Allergen Reactions   Nickel Rash          PHYSICAL EXAMINATION: Vital signs: BP 100/70    Pulse 75    Ht 5\' 8"  (1.727 m)    Wt 173 lb (78.5 kg)  BMI 26.30 kg/m   Constitutional: generally well-appearing, no acute distress Psychiatric: alert and oriented x3, cooperative Eyes: extraocular movements intact, anicteric, conjunctiva pink Mouth: oral pharynx moist, no lesions Neck: supple no lymphadenopathy Cardiovascular: heart regular rate and rhythm, no murmur Lungs: clear to auscultation bilaterally Abdomen: soft, nontender, nondistended, no obvious ascites, no peritoneal signs, normal bowel sounds, no organomegaly Rectal: Deferred until colonoscopy Extremities: no clubbing, cyanosis, or lower extremity edema bilaterally Skin: no lesions on visible extremities Neuro: No focal deficits.  Cranial nerves intact   ASSESSMENT:   1.  Chronic GERD.  Requires PPI to control  symptoms. 2.  Intermittent solid food dysphagia.  Known stricture on remote endoscopy.  Suspect peptic stricture. 3.  Screening colonoscopy.  Baseline risk for neoplasia.  Last examination 2012 was negative for neoplasia 4.  Multiple general medical problems, including insulin requiring diabetes.  Stable.   PLAN:   1.  Reflux precautions 2.  Continue omeprazole 40 mg daily.  Medication risks reviewed 3.  Schedule upper endoscopy with probable esophageal dilation.  The patient is higher than baseline risk due to her comorbidities and the need to address her diabetic therapy for her procedure.The nature of the procedure, as well as the risks, benefits, and alternatives were carefully and thoroughly reviewed with the patient. Ample time for discussion and questions allowed. The patient understood, was satisfied, and agreed to proceed.  4.  Schedule screening colonoscopy.  Higher than baseline risk as above.The nature of the procedure, as well as the risks, benefits, and alternatives were carefully and thoroughly reviewed with the patient. Ample time for discussion and questions allowed. The patient understood, was satisfied, and agreed to proceed.

## 2021-12-18 ENCOUNTER — Telehealth: Payer: Self-pay | Admitting: *Deleted

## 2021-12-18 NOTE — Telephone Encounter (Signed)
°  Follow up Call-  Call back number 12/16/2021  Post procedure Call Back phone  # [phone]  Permission to leave phone message Yes  Some recent data might be hidden     Patient questions:  Do you have a fever, pain , or abdominal swelling? No. Pain Score  0 *  Have you tolerated food without any problems? Yes.    Have you been able to return to your normal activities? Yes.    Do you have any questions about your discharge instructions: Diet   No. Medications  No. Follow up visit  No.  Do you have questions or concerns about your Care? No.  Actions: * If pain score is 4 or above: No action needed, pain <4.

## 2022-02-04 ENCOUNTER — Other Ambulatory Visit: Payer: Self-pay

## 2022-02-04 ENCOUNTER — Ambulatory Visit: Payer: Medicare Other | Admitting: Cardiology

## 2022-02-04 ENCOUNTER — Encounter: Payer: Self-pay | Admitting: Cardiology

## 2022-02-04 VITALS — BP 128/70 | HR 75 | Ht 68.5 in | Wt 179.4 lb

## 2022-02-04 DIAGNOSIS — E782 Mixed hyperlipidemia: Secondary | ICD-10-CM

## 2022-02-04 DIAGNOSIS — I25119 Atherosclerotic heart disease of native coronary artery with unspecified angina pectoris: Secondary | ICD-10-CM | POA: Diagnosis not present

## 2022-02-04 DIAGNOSIS — I10 Essential (primary) hypertension: Secondary | ICD-10-CM | POA: Diagnosis not present

## 2022-02-04 NOTE — Patient Instructions (Signed)
Medication Instructions:  Your physician recommends that you continue on your current medications as directed. Please refer to the Current Medication list given to you today.   Labwork: None today  Testing/Procedures: None today  Follow-Up: 1 year  Any Other Special Instructions Will Be Listed Below (If Applicable).  If you need a refill on your cardiac medications before your next appointment, please call your pharmacy.  

## 2022-02-04 NOTE — Progress Notes (Signed)
Cardiology Office Note  Date: 02/04/2022   ID: Rhonda, Allen [birth year], MRN [id number]  PCP:  Shon Baton, MD  Cardiologist:  Rozann Lesches, MD Electrophysiologist:  None   Chief Complaint  Patient presents with   Cardiac follow-up    History of Present Illness: Rhonda Allen is a 66 y.o. female last seen in January 2022.  She is here for a routine visit.  Continues to do very well, no angina symptoms or nitroglycerin use.  She walks 3 to 4 days a week on a 1.7 mile loop with both uphill and downhill sections.  Reports NYHA class I dyspnea.  She has a physical scheduled with her PCP in the next few weeks.  I reviewed her medications which are outlined below.  She reports compliance with therapy, no obvious intolerances.  She will have her lipids reassessed at pending PCP visit.  Past Medical History:  Diagnosis Date   Anemia    CAD (coronary artery disease)    DES to proximal LAD August 2017   Diverticulosis of colon    Essential hypertension    Frozen shoulder    Right   GERD (gastroesophageal reflux disease)    Hemorrhoids    NSTEMI (non-ST elevated myocardial infarction) ([place]) 2017   Type 2 diabetes mellitus ([place])     Past Surgical History:  Procedure Laterality Date   CARDIAC CATHETERIZATION N/A 08/17/2016   Procedure: Left Heart Cath and Coronary Angiography;  Surgeon: Nelva Bush, MD;  Location: [facility];  Service: Cardiovascular;  Laterality: N/A;   CARDIAC CATHETERIZATION N/A 08/17/2016   Procedure: Coronary Stent Intervention;  Surgeon: Nelva Bush, MD;  Location: [facility];  Service: Cardiovascular;  Laterality: N/A;   CARDIAC CATHETERIZATION N/A 08/17/2016   Procedure: Intravascular Ultrasound/IVUS;  Surgeon: Nelva Bush, MD;  Location: [facility];  Service: Cardiovascular;  Laterality: N/A;   DILATION AND CURETTAGE OF UTERUS     SHOULDER SURGERY     bone spur left shoulder   WISDOM TOOTH EXTRACTION       Current Outpatient Medications  Medication Sig Dispense Refill   ACCU-CHEK AVIVA PLUS test strip      aspirin 81 MG tablet Take 81 mg by mouth daily.     atorvastatin (LIPITOR) 80 MG tablet Take 1 tablet (80 mg total) by mouth daily at 6 PM. 30 tablet 5   B-D ULTRAFINE III SHORT PEN 31G X 8 MM MISC      BD INSULIN SYRINGE ULTRAFINE 31G X 5/16" 0.3 ML MISC      cetirizine (ZYRTEC) 10 MG tablet Take 10 mg by mouth daily.     Cholecalciferol (VITAMIN D3) 10 MCG (400 UNIT) CAPS Take 1 capsule by mouth daily.     Ferrous Sulfate (IRON) 325 (65 FE) MG TABS Take 1 tablet by mouth every Monday, Wednesday, and Friday.      Insulin Glargine (BASAGLAR KWIKPEN) 100 UNIT/ML SOPN Inject into the skin daily.     insulin lispro (HUMALOG) 100 UNIT/ML injection Inject 8-15 Units into the skin 3 (three) times daily before meals. Per sliding scale     lisinopril (PRINIVIL,ZESTRIL) 10 MG tablet Take 10 mg by mouth daily.     metoprolol tartrate (LOPRESSOR) 25 MG tablet TAKE ONE-HALF TABLET BY MOUTH TWICE A DAY 90 tablet 0   Multiple Vitamin (MULTIVITAMIN) tablet Take 1 tablet by mouth every Monday, Wednesday, and Friday.     nitroGLYCERIN (NITROSTAT) 0.4 MG SL tablet PLACE  ONE TABLET UNDER THE TONGUE EVERY FIVE MINUTES AS NEEDED FOR CHEST PAIN 25 tablet 3   Omega-3 Fatty Acids (FISH OIL PO) Take 1 capsule by mouth See admin instructions. Once to twice daily     omeprazole (PRILOSEC) 40 MG capsule TAKE ONE CAPSULE BY MOUTH DAILY 30 capsule 2   polycarbophil (FIBERCON) 625 MG tablet Take 625 mg by mouth daily.     No current facility-administered medications for this visit.   Allergies:  Nickel   ROS: No palpitations or syncope.  Physical Exam: VS:  BP 128/70    Pulse 75    Ht 5' 8.5" (1.74 m)    Wt 179 lb 6.4 oz (81.4 kg)    SpO2 98%    BMI 26.88 kg/m , BMI Body mass index is 26.88 kg/m.  Wt Readings from Last 3 Encounters:  02/04/22 179 lb 6.4 oz (81.4 kg)  12/16/21 173 lb (78.5 kg)  10/01/21 173  lb (78.5 kg)    General: Patient appears comfortable at rest. HEENT: Conjunctiva and lids normal, wearing a mask. Neck: Supple, no elevated JVP or carotid bruits, no thyromegaly. Lungs: Clear to auscultation, nonlabored breathing at rest. Cardiac: Regular rate and rhythm, no S3, 1/6 systolic murmur, no pericardial rub. Extremities: No pitting edema.  ECG:  An ECG dated 12/30/2020 was personally reviewed today and demonstrated:  Sinus rhythm.  Recent Labwork:  No recent lab work available for review today.  Other Studies Reviewed Today:  Cardiac catheterization and PCI 08/17/2016: Severe single-vessel coronary artery disease with 99% stenosis of the proximal LAD. Successful IVUS-guided PCI to the proximal LAD with placement of an Integrity Resolute 3.5 x 15 mm drug-eluting stent with 0% residual stenosis and TIMI-3 flow. Upper normal left ventricular filling pressure.   Echocardiogram 07/14/2017: Study Conclusions   - Left ventricle: The cavity size was normal. Wall thickness was   normal. Systolic function was normal. The estimated ejection   fraction was 55%. Features are consistent with a pseudonormal   left ventricular filling pattern, with concomitant abnormal   relaxation and increased filling pressure (grade 2 diastolic   dysfunction). Doppler parameters are consistent with high   ventricular filling pressure. - Regional wall motion abnormality: Mild hypokinesis of the apical   anterior and apical myocardium. - Aortic valve: Mildly calcified annulus. Trileaflet. There was   trivial regurgitation. - Tricuspid valve: There was mild regurgitation.  Assessment and Plan:  1.  CAD status post DES to the proximal LAD in 2017.  She continues on medical therapy and with regular walking plan, no angina symptoms or nitroglycerin use.  ECG reviewed today.  Continue aspirin, Lopressor, lisinopril, Lipitor, and as needed nitroglycerin.  We discussed warning signs and symptoms that would  prompt further ischemic evaluation.  2.  Mixed hyperlipidemia, on high-dose Lipitor.  She will have follow-up lab work with PCP in the next few weeks.  New LDL goal is 55 or less.  3.  Essential hypertension, blood pressure is well controlled today.  No changes made to present regimen.  Medication Adjustments/Labs and Tests Ordered: Current medicines are reviewed at length with the patient today.  Concerns regarding medicines are outlined above.   Tests Ordered: Orders Placed This Encounter  Procedures   EKG 12-Lead    Medication Changes: No orders of the defined types were placed in this encounter.   Disposition:  Follow up  1 year, sooner if needed.  Signed, Satira Sark, MD, [facility] 02/04/2022 10:41 AM    [facility] at [facility] [address], [place], [place] [address] Phone: [phone]; Fax: [phone]

## 2023-01-21 ENCOUNTER — Encounter: Payer: Self-pay | Admitting: Internal Medicine

## 2023-03-29 NOTE — Progress Notes (Unsigned)
    Cardiology Office Note  Date: 03/30/2023   ID: Stana, Wakeford April 22, 1956, MRN [id number]  History of Present Illness: Rhonda Allen is a 66 y.o. female last seen in February 2023.  She is here for a routine visit.  States that she has noticed more of a sense of shortness of breath with activity over the last year.  Still does her usual 1.8 mile walk 3 to 4 days a week, no definite chest tightness.  Also reports interval diagnosis of rheumatoid arthritis, following with a rheumatologist.  She is now on methotrexate.  I reviewed her medications which are otherwise stable from a cardiac perspective.  ECG today shows normal sinus rhythm.  She has not undergone interval ischemic testing since stent intervention in 2017.  I reviewed her recent lab work from PCP.  Physical Exam: VS:  BP 134/62   Pulse 65   Ht 5\' 8"  (1.727 m)   Wt 183 lb 12.8 oz (83.4 kg)   SpO2 97%   BMI 27.95 kg/m , BMI Body mass index is 27.95 kg/m.  Wt Readings from Last 3 Encounters:  03/30/23 183 lb 12.8 oz (83.4 kg)  02/04/22 179 lb 6.4 oz (81.4 kg)  12/16/21 173 lb (78.5 kg)    General: Patient appears comfortable at rest. HEENT: Conjunctiva and lids normal. Neck: Supple, no elevated JVP or carotid bruits. Lungs: Clear to auscultation, nonlabored breathing at rest. Cardiac: Regular rate and rhythm, no S3 or significant systolic murmur. Extremities: No pitting edema.  ECG:  An ECG dated 02/04/2022 was personally reviewed today and demonstrated:  Sinus rhythm with rightward axis.  Labwork:  January 2024: Hemoglobin 13.1, platelets 216 February 2024: BUN 20, creatinine 0.95, potassium 4.5, AST 29, ALT 33 02/15/2023: BUN 23, creatinine 0.9, potassium 4.1, AST 37, ALT 45, hemoglobin 12.3, platelets 175, TSH 1.93, hemoglobin A1c 8.5%  Other Studies Reviewed Today:  No interval cardiac testing for review today.  Assessment and Plan:  1.  CAD status post DES to proximal LAD in 2017.  LVEF  approximately 55% by last assessment in 2018.  She does note more dyspnea on exertion at times with typical walking plan, no definite chest tightness.  Cardiac regimen includes aspirin, Lipitor, Lopressor, and lisinopril.  No recent nitroglycerin use.  ECG reviewed today without acute changes.  Plan to pursue follow-up ischemic testing in the form of a Lexiscan Myoview.  2.  Mixed hyperlipidemia, on Lipitor.  3.  Essential hypertension.  Blood pressure is reasonable today, no changes made to present regimen.  Disposition:  Follow up  test results.  Signed, Satira Sark, M.D., F.A.C.C. [place] at [facility]

## 2023-03-30 ENCOUNTER — Encounter: Payer: Self-pay | Admitting: Cardiology

## 2023-03-30 ENCOUNTER — Encounter: Payer: Self-pay | Admitting: Internal Medicine

## 2023-03-30 ENCOUNTER — Ambulatory Visit: Payer: Medicare Other | Attending: Cardiology | Admitting: Cardiology

## 2023-03-30 VITALS — BP 134/62 | HR 65 | Ht 68.0 in | Wt 183.8 lb

## 2023-03-30 DIAGNOSIS — I25119 Atherosclerotic heart disease of native coronary artery with unspecified angina pectoris: Secondary | ICD-10-CM

## 2023-03-30 DIAGNOSIS — I10 Essential (primary) hypertension: Secondary | ICD-10-CM

## 2023-03-30 DIAGNOSIS — E782 Mixed hyperlipidemia: Secondary | ICD-10-CM | POA: Diagnosis not present

## 2023-03-30 NOTE — Patient Instructions (Signed)
Medication Instructions:  Your physician recommends that you continue on your current medications as directed. Please refer to the Current Medication list given to you today.   Labwork: None today  Testing/Procedures: Your physician has requested that you have a lexiscan myoview. For further information please visit [email or web address]. Please follow instruction sheet, as given.   Follow-Up: 1 year  Any Other Special Instructions Will Be Listed Below (If Applicable).  If you need a refill on your cardiac medications before your next appointment, please call your pharmacy.

## 2023-03-30 NOTE — Addendum Note (Signed)
Addended by: Barbarann Ehlers A on: 03/30/2023 12:38 PM   Modules accepted: Orders

## 2023-04-08 ENCOUNTER — Ambulatory Visit (HOSPITAL_COMMUNITY)
Admission: RE | Admit: 2023-04-08 | Discharge: 2023-04-08 | Disposition: A | Discharge status: Home / Self Care | Payer: Medicare Other | Source: Ambulatory Visit | Attending: Cardiology | Admitting: Cardiology

## 2023-04-08 ENCOUNTER — Telehealth: Payer: Self-pay

## 2023-04-08 ENCOUNTER — Encounter (HOSPITAL_BASED_OUTPATIENT_CLINIC_OR_DEPARTMENT_OTHER)
Admission: RE | Admit: 2023-04-08 | Discharge: 2023-04-08 | Disposition: A | Discharge status: Home / Self Care | Payer: Medicare Other | Source: Ambulatory Visit | Attending: Cardiology | Admitting: Cardiology

## 2023-04-08 DIAGNOSIS — I25119 Atherosclerotic heart disease of native coronary artery with unspecified angina pectoris: Secondary | ICD-10-CM | POA: Diagnosis present

## 2023-04-08 LAB — NM MYOCAR MULTI W/SPECT W/WALL MOTION / EF
LV dias vol: 90 mL (ref 46–106)
LV sys vol: 22 mL
Nuc Stress EF: 76 %
Peak HR: 114 {beats}/min
RATE: 0.4
Rest HR: 58 {beats}/min
Rest Nuclear Isotope Dose: 8.3 mCi
SDS: 3
SRS: 0
SSS: 3
Stress Nuclear Isotope Dose: 27.4 mCi
TID: 1

## 2023-04-08 MED ORDER — SODIUM CHLORIDE FLUSH 0.9 % IV SOLN
INTRAVENOUS | Status: AC
  Administered 2023-04-08: 10 mL via INTRAVENOUS
  Filled 2023-04-08: qty 10

## 2023-04-08 MED ORDER — ISOSORBIDE MONONITRATE ER 30 MG PO TB24: 15.0000 mg | ORAL_TABLET | Freq: Every day | ORAL | 3 refills | Status: DC

## 2023-04-08 MED ORDER — REGADENOSON 0.4 MG/5ML IV SOLN
INTRAVENOUS | Status: AC
  Administered 2023-04-08: 0.4 mg via INTRAVENOUS
  Filled 2023-04-08: qty 5

## 2023-04-08 MED ORDER — TECHNETIUM TC 99M TETROFOSMIN IV KIT
8.3000 | PACK | Freq: Once | INTRAVENOUS | Status: AC | PRN
  Administered 2023-04-08: 8.3 via INTRAVENOUS

## 2023-04-08 MED ORDER — TECHNETIUM TC 99M TETROFOSMIN IV KIT
27.4000 | PACK | Freq: Once | INTRAVENOUS | Status: AC | PRN
  Administered 2023-04-08: 27.4 via INTRAVENOUS

## 2023-04-08 NOTE — Telephone Encounter (Signed)
-----   Message from Jonelle Sidle, MD sent at 04/08/2023  2:20 PM EDT ----- Results reviewed.  I also reviewed the images myself.  Anteroapical defect noted although this could be due to variable breast attenuation rather than ischemic territory.  Since she has had some exertional symptoms, let's try and add Imdur starting at 15 mg daily and increase to 30 mg daily as tolerated.  Please schedule follow-up visit within the next 4 to 6 weeks to see how she is doing.  We can always pursue a cardiac catheterization if symptoms worsen or do not respond to medical therapy.

## 2023-04-08 NOTE — Telephone Encounter (Signed)
Results discussed with patient.She agrees to start Imdur 15 mg daily and f/u on 05/13/23 at 3:30 pm-arrive at 3:15 pm with B.Strader,PA-C   Results copied to pcp

## 2023-05-13 ENCOUNTER — Ambulatory Visit: Payer: Medicare Other | Attending: Student | Admitting: Student

## 2023-05-13 ENCOUNTER — Encounter: Payer: Self-pay | Admitting: Student

## 2023-05-13 VITALS — BP 130/78 | HR 67 | Ht 68.0 in | Wt 181.6 lb

## 2023-05-13 DIAGNOSIS — E785 Hyperlipidemia, unspecified: Secondary | ICD-10-CM | POA: Diagnosis not present

## 2023-05-13 DIAGNOSIS — I10 Essential (primary) hypertension: Secondary | ICD-10-CM

## 2023-05-13 DIAGNOSIS — I25119 Atherosclerotic heart disease of native coronary artery with unspecified angina pectoris: Secondary | ICD-10-CM

## 2023-05-13 NOTE — Patient Instructions (Signed)
Medication Instructions:  Your physician recommends that you continue on your current medications as directed. Please refer to the Current Medication list given to you today.  *If you need a refill on your cardiac medications before your next appointment, please call your pharmacy*   Lab Work: NONE   If you have labs (blood work) drawn today and your tests are completely normal, you will receive your results only by: MyChart Message (if you have MyChart) OR A paper copy in the mail If you have any lab test that is abnormal or we need to change your treatment, we will call you to review the results.   Testing/Procedures: None    Follow-Up: At [facility], you and your health needs are our priority.  As part of our continuing mission to provide you with exceptional heart care, we have created designated Provider Care Teams.  These Care Teams include your primary Cardiologist (physician) and Advanced Practice Providers (APPs -  Physician Assistants and Nurse Practitioners) who all work together to provide you with the care you need, when you need it.  We recommend signing up for the patient portal called "MyChart".  Sign up information is provided on this After Visit Summary.  MyChart is used to connect with patients for Virtual Visits (Telemedicine).  Patients are able to view lab/test results, encounter notes, upcoming appointments, etc.  Non-urgent messages can be sent to your provider as well.   To learn more about what you can do with MyChart, go to [email or web address].    Your next appointment:   6 month(s)  Provider:   Nona Dell, MD or Randall An, PA-C    Other Instructions  Thank you for choosing [facility]!

## 2023-05-13 NOTE — Progress Notes (Signed)
Cardiology Office Note    Date:  05/13/2023  ID:  Brione, Bothun February 06, 1956, MRN [id number] Cardiologist: Nona Dell, MD    History of Present Illness:    Rhonda Allen is a 67 y.o. female with past medical history of CAD (s/p NSTEMI in 07/2016 with DES to proximal-LAD), HTN, HLD, Type 2 DM and RA who presents to the office today for follow-up from her recent stress test.  She was examined by Dr. Diona Browner in 03/2023 and reported having worsening dyspnea on exertion over the past year but no associated chest pain. Given no recent ischemic testing, a Lexiscan Myoview was recommended for further evaluation. This showed evidence of prior apical infarction with mild to moderate peri-infarct ischemia but this was also in the setting of breast attenuation. The study was overall low to intermediate risk. It was recommended to start Imdur 15 mg daily and increase to 30 mg daily if tolerated and to arrange for a follow-up visit in several weeks. If symptoms did not improve with medical therapy, would pursue a cardiac catheterization.  In talking with the patient today, she reports her dyspnea on exertion improved after being started on Imdur. She previously noticed dyspnea when walking on the [place] as she does this several times per week but says symptoms have improved with medical therapy. She denies any associated chest pain or palpitations. Has not had to use SL NTG. No recent orthopnea, PND or pitting edema. She is being followed by Rheumatology for RA and is undergoing medication changes.  Studies Reviewed:   EKG: EKG is not ordered today.  LHC: 07/2016 Severe single-vessel coronary artery disease with 99% stenosis of the proximal LAD. Successful IVUS-guided PCI to the proximal LAD with placement of an Integrity Resolute 3.5 x 15 mm drug-eluting stent with 0% residual stenosis and TIMI-3 flow. Upper normal left ventricular filling pressure.   Plan: Continue dual  antiplatelet therapy with aspirin and ticagrelor for at least 12 months. Aggressive secondary prevention, including continued high-potency statin therapy. Continue ACE-inhibitor; add beta-blocker as heart rate and blood pressure allow.  NST: 03/2023   ECG rhythm shows normal sinus rhythm. Short runs of SVT at rest up to 6 beats.   Inferior and lateral precordial Twave inversions after lexiscan   Findings are consistent with prior apical infarction with mild to moderate  peri-infarct ischemia.   LV perfusion is abnormal. Large moderate intensity anterior/anteroseptal defect that is most intense in the resting images with normal wall motion most consistent with breast attenuation. There is a small moderate intensity apical defect with mild to moderate reversibility suggesitve of apical infarct with mild to moderate peri-infarct ischemia.   Left ventricular function is normal. Nuclear stress EF: 76 %. The left ventricular ejection fraction is hyperdynamic (>65%). End diastolic cavity size is normal.   Low to intermediate risk   Physical Exam:   VS:  BP 130/78   Pulse 67   Ht 5\' 8"  (1.727 m)   Wt 181 lb 9.6 oz (82.4 kg)   SpO2 96%   BMI 27.61 kg/m    Wt Readings from Last 3 Encounters:  05/13/23 181 lb 9.6 oz (82.4 kg)  03/30/23 183 lb 12.8 oz (83.4 kg)  02/04/22 179 lb 6.4 oz (81.4 kg)     GEN: Well nourished, well developed female appearing in no acute distress NECK: No JVD; No carotid bruits CARDIAC: RRR, no murmurs, rubs, gallops RESPIRATORY:  Clear to auscultation without rales, wheezing or rhonchi  ABDOMEN:  Appears non-distended. No obvious abdominal masses. EXTREMITIES: No clubbing or cyanosis. No pitting edema.  Distal pedal pulses are 2+ bilaterally.   Assessment and Plan:   1. CAD - She is s/p NSTEMI in 07/2016 with DES to proximal-LAD and recent NST showed evidence of prior apical infarction with mild to moderate peri-infarct ischemia but this was also in the setting of  breast attenuation. Reviewed test results with the patient today and she reports her dyspnea has improved since being started on Imdur. At this time, she will continue on medical therapy and I encouraged her to make [place] aware if she experiences progression of symptoms as Imdur could be further titrated to 30 mg daily. We reviewed that if she has recurrent symptoms despite optimal medical therapy, the next step would be a cardiac catheterization. - At this time, will continue current medications with ASA 81 mg daily, Atorvastatin 80 mg daily, Imdur 15 mg daily and Lopressor 12.5 mg twice daily.  2. HTN - Her blood pressure is well-controlled at 130/78 during today's visit and she reports it has also been well-controlled when checked at home. Continue current medications with Imdur 15 mg daily, Lisinopril 10 mg daily and Lopressor 12.5 mg twice daily.  3. HLD - FLP in 01/2023 showed total cholesterol 145, triglycerides 46, HDL 66 and LDL 70. Continue current medical therapy with Atorvastatin 80 mg daily and Fish Oil.   Disposition: Will plan for follow-up in 6 months but I encouraged her to reach out in the interim with any progression of symptoms as she may require additional medication adjustments or a subsequent visit.    Signed, Ellsworth Lennox, PA-C

## 2023-05-27 ENCOUNTER — Ambulatory Visit: Payer: Medicare Other | Admitting: Internal Medicine

## 2023-05-27 ENCOUNTER — Encounter: Payer: Self-pay | Admitting: Internal Medicine

## 2023-05-27 VITALS — BP 136/70 | HR 66 | Ht 68.0 in | Wt 183.0 lb

## 2023-05-27 DIAGNOSIS — K219 Gastro-esophageal reflux disease without esophagitis: Secondary | ICD-10-CM

## 2023-05-27 DIAGNOSIS — Z1211 Encounter for screening for malignant neoplasm of colon: Secondary | ICD-10-CM

## 2023-05-27 DIAGNOSIS — R1310 Dysphagia, unspecified: Secondary | ICD-10-CM | POA: Diagnosis not present

## 2023-05-27 DIAGNOSIS — K222 Esophageal obstruction: Secondary | ICD-10-CM | POA: Diagnosis not present

## 2023-05-27 NOTE — Progress Notes (Signed)
HISTORY OF PRESENT ILLNESS:  Rhonda Allen is a 67 y.o. female with past medical history as listed below presents today for follow-up regarding management of her chronic reflux disease.  She was last seen December 16, 2021 when she underwent screening colonoscopy and upper endoscopy with esophageal dilation for symptomatic peptic stricture.  Colonoscopy revealed diverticulosis and hemorrhoids.  No neoplasia.  Follow-up in 10 years recommended.  Upper endoscopy revealed a benign distal esophageal stricture which was dilated with 54 French Maloney dilator.  No other abnormalities.  Since that time she has continued on over-the-counter omeprazole 40 mg daily.  On medication no reflux symptoms.  If she misses a dose of medication she will have significant pyrosis.  She has had no recurrence of her dysphagia since dilation.  She is pleased.  GI review of systems is otherwise negative.  REVIEW OF SYSTEMS:  All non-GI ROS negative unless stated in the HPI except for sinus and allergy, skin rash, sleeping problems, arthritis  Past Medical History:  Diagnosis Date   Anemia    CAD (coronary artery disease)    DES to proximal LAD August 2017   Diverticulosis of colon    Essential hypertension    Frozen shoulder    Right   GERD (gastroesophageal reflux disease)    Hemorrhoids    NSTEMI (non-ST elevated myocardial infarction) ([facility]) 2017   Rheumatoid aortitis    Type 2 diabetes mellitus ([facility])     Past Surgical History:  Procedure Laterality Date   CARDIAC CATHETERIZATION N/A 08/17/2016   Procedure: Left Heart Cath and Coronary Angiography;  Surgeon: Yvonne Kendall, MD;  [facility];  Service: Cardiovascular;  Laterality: N/A;   CARDIAC CATHETERIZATION N/A 08/17/2016   Procedure: Coronary Stent Intervention;  Surgeon: Yvonne Kendall, MD;  Location: [facility] INVASIVE CV LAB;  Service: Cardiovascular;  Laterality: N/A;   CARDIAC CATHETERIZATION N/A 08/17/2016   Procedure: Intravascular  Ultrasound/IVUS;  Surgeon: Yvonne Kendall, MD;  Location: [facility] INVASIVE CV LAB;  Service: Cardiovascular;  Laterality: N/A;   DILATION AND CURETTAGE OF UTERUS     SHOULDER SURGERY     bone spur left shoulder   WISDOM TOOTH EXTRACTION      Social History Rhonda Allen  reports that she has never smoked. She has never used smokeless tobacco. She reports that she does not drink alcohol and does not use drugs.  family history includes Cancer in her father; Hypertension in her mother; Irritable bowel syndrome in her mother; Ulcers in her sister.  Allergies  Allergen Reactions   Nickel Rash       PHYSICAL EXAMINATION: Vital signs: BP 136/70   Pulse 66   Ht 5\' 8"  (1.727 m)   Wt 183 lb (83 kg)   BMI 27.83 kg/m   Constitutional: generally well-appearing, no acute distress Psychiatric: alert and oriented x3, cooperative Eyes: extraocular movements intact, anicteric, conjunctiva pink Mouth: oral pharynx moist, no lesions Neck: supple no lymphadenopathy Cardiovascular: heart regular rate and rhythm, no murmur Lungs: clear to auscultation bilaterally Abdomen: soft, nontender, nondistended, no obvious ascites, no peritoneal signs, normal bowel sounds, no organomegaly Rectal: Omitted Extremities: no clubbing, cyanosis, or lower extremity edema bilaterally Skin: no lesions on visible extremities Neuro: No focal deficits.  Cranial nerves intact  ASSESSMENT:  1.  GERD complicated by peptic stricture.  Asymptomatic post dilation on PPI 2.  Colon cancer screening.  Up-to-date   PLAN:  1.  Reflux precautions 2.  Continue omeprazole 40 mg daily.  Medication risks  reviewed. 3.  Repeat screening colonoscopy 2032 4.  Routine office follow-up 1 year.  Contact the office in the interim for any questions or problems.  She agrees. 5.  Resume general medical care with Dr. Timothy Lasso

## 2023-05-27 NOTE — Patient Instructions (Signed)
Please follow up in one year.  _______________________________________________________  If your blood pressure at your visit was 140/90 or greater, please contact your primary care physician to follow up on this.  _______________________________________________________  If you are age 67 or older, your body mass index should be between 23-30. Your Body mass index is 27.83 kg/m. If this is out of the aforementioned range listed, please consider follow up with your Primary Care Provider.  If you are age 81 or younger, your body mass index should be between 19-25. Your Body mass index is 27.83 kg/m. If this is out of the aformentioned range listed, please consider follow up with your Primary Care Provider.   ________________________________________________________  The [place] [facility] providers would like to encourage you to use [facility] to communicate with providers for non-urgent requests or questions.  Due to long hold times on the telephone, sending your provider a message by [facility] may be a faster and more efficient way to get a response.  Please allow 48 business hours for a response.  Please remember that this is for non-urgent requests.  _______________________________________________________

## 2023-11-22 ENCOUNTER — Encounter: Payer: Self-pay | Admitting: Cardiology

## 2023-11-22 ENCOUNTER — Ambulatory Visit: Payer: Medicare Other | Attending: Cardiology | Admitting: Cardiology

## 2023-11-22 VITALS — BP 122/70 | HR 80 | Ht 68.0 in | Wt 182.2 lb

## 2023-11-22 DIAGNOSIS — I10 Essential (primary) hypertension: Secondary | ICD-10-CM | POA: Diagnosis not present

## 2023-11-22 DIAGNOSIS — E782 Mixed hyperlipidemia: Secondary | ICD-10-CM

## 2023-11-22 DIAGNOSIS — I25119 Atherosclerotic heart disease of native coronary artery with unspecified angina pectoris: Secondary | ICD-10-CM

## 2023-11-22 MED ORDER — ISOSORBIDE MONONITRATE ER 30 MG PO TB24: 30.0000 mg | ORAL_TABLET | Freq: Every day | ORAL | 1 refills | Status: DC

## 2023-11-22 NOTE — Progress Notes (Signed)
    Cardiology Office Note  Date: 11/22/2023   ID: [name], [name] 19-May-1956, MRN [id number]  History of Present Illness: Rhonda Allen is a 67 y.o. female last seen in May by Ms. Strader PA-C, I reviewed the note.  She is here for a follow-up visit.  Reports no significant angina with her typical walk (1.7 miles 3 to 4 days a week).  No increasing shortness of breath, no palpitations or syncope.  I reviewed her medications.  Current cardiac regimen includes aspirin, Lipitor, Imdur, lisinopril, Lopressor, omega-3 supplements, and as needed nitroglycerin.  We discussed changing her Imdur to 30 mg daily, she tolerated the introductory dose.  Physical Exam: VS:  BP 122/70   Pulse 80   Ht 5\' 8"  (1.727 m)   Wt 182 lb 3.2 oz (82.6 kg)   SpO2 94%   BMI 27.70 kg/m , BMI Body mass index is 27.7 kg/m.  Wt Readings from Last 3 Encounters:  11/22/23 182 lb 3.2 oz (82.6 kg)  05/27/23 183 lb (83 kg)  05/13/23 181 lb 9.6 oz (82.4 kg)    General: Patient appears comfortable at rest. HEENT: Conjunctiva and lids normal Neck: Supple, no elevated JVP or carotid bruits. Lungs: Clear to auscultation, nonlabored breathing at rest. Cardiac: Regular rate and rhythm, no S3 or significant systolic murmur, no pericardial rub.  ECG:  An ECG dated 03/30/2023 was personally reviewed today and demonstrated:  Sinus rhythm.  Labwork:  February 2024: Cholesterol 145, triglycerides 46, HDL 66, LDL 70 October 2024: Hemoglobin 13.2, platelets 141, BUN 28, creatinine 1.26, GFR 47, AST 26, ALT 29  Other Studies Reviewed Today:  No interval cardiac testing for review today.  Assessment and Plan:  1.  CAD status post DES to proximal LAD in 2017.  Follow-up Lexiscan Myoview in April showed an anterior/apical defect in the setting of breast attenuation, not exclusive of ischemia, LVEF vigorous at 76%.  At this point doing well without active angina on medical therapy.  Continue regular walking plan  and observation.  She is on aspirin, Lipitor, Imdur, Lopressor, and as needed nitroglycerin.   2.  Mixed hyperlipidemia, on Lipitor.  LDL 70 and February of this year.   3.  Primary hypertension.  No changes made to present regimen.  Blood pressure is well-controlled today.  Disposition:  Follow up  6 months.  Signed, Jonelle Sidle, M.D., F.A.C.C. [facility]

## 2023-11-22 NOTE — Patient Instructions (Addendum)
Medication Instructions:  Your physician has recommended you make the following change in your medication:  Increase isosorbide mononitrate to 30 mg daily Continue all other medications as prescribed  Labwork: none  Testing/Procedures: none  Follow-Up: Your physician recommends that you schedule a follow-up appointment in: 6 months  Any Other Special Instructions Will Be Listed Below (If Applicable).  If you need a refill on your cardiac medications before your next appointment, please call your pharmacy.

## 2024-05-31 ENCOUNTER — Encounter: Payer: Self-pay | Admitting: Cardiology

## 2024-05-31 ENCOUNTER — Ambulatory Visit: Payer: Medicare Other | Attending: Cardiology | Admitting: Cardiology

## 2024-05-31 VITALS — BP 138/78 | HR 71 | Ht 68.0 in | Wt 186.0 lb

## 2024-05-31 DIAGNOSIS — I10 Essential (primary) hypertension: Secondary | ICD-10-CM | POA: Diagnosis not present

## 2024-05-31 DIAGNOSIS — E782 Mixed hyperlipidemia: Secondary | ICD-10-CM

## 2024-05-31 DIAGNOSIS — I25119 Atherosclerotic heart disease of native coronary artery with unspecified angina pectoris: Secondary | ICD-10-CM

## 2024-05-31 MED ORDER — NITROGLYCERIN 0.4 MG SL SUBL: 0.4000 mg | SUBLINGUAL_TABLET | SUBLINGUAL | 3 refills | Status: AC | PRN

## 2024-05-31 NOTE — Patient Instructions (Addendum)

## 2024-05-31 NOTE — Progress Notes (Signed)
    Cardiology Office Note  Date: 05/31/2024   ID: Rhonda, Allen 25-Jan-1956, MRN [id number]  History of Present Illness: Rhonda Allen is a 68 y.o. female last seen in November 2024.  She is here for a routine visit.  Reports no angina or interval nitroglycerin  use, stable NYHA class II dyspnea.  Discussed plan to continue walking regimen for exercise.  We went over her medications.  No change in cardiac regimen.  Refill provided for fresh bottle of nitroglycerin .  I reviewed her interval lab work as noted below.  She continues to follow with Dr. Mamie Searles for primary care.  I reviewed her ECG today which shows normal sinus rhythm with lead motion artifact.  Physical Exam: VS:  BP 138/78 (BP Location: Left Arm)   Pulse 71   Ht 5\' 8"  (1.727 m)   Wt 186 lb (84.4 kg)   SpO2 97%   BMI 28.28 kg/m , BMI Body mass index is 28.28 kg/m.  Wt Readings from Last 3 Encounters:  05/31/24 186 lb (84.4 kg)  11/22/23 182 lb 3.2 oz (82.6 kg)  05/27/23 183 lb (83 kg)    General: Patient appears comfortable at rest. HEENT: Conjunctiva and lids normal. Neck: Supple, no elevated JVP or carotid bruits. Lungs: Clear to auscultation, nonlabored breathing at rest. Cardiac: Regular rate and rhythm, no S3 or significant systolic murmur, no pericardial rub.  ECG:  An ECG dated 03/30/2023 was personally reviewed today and demonstrated:  Sinus rhythm.  Labwork:  March 2025: Apolipoprotein B 49 April 2025: Hemoglobin 11.8, platelets 142, BUN 23, creatinine 1.01, potassium 4.4, AST 24, ALT 25, CRP less than 1  Other Studies Reviewed Today:  No interval cardiac testing for review today.  Assessment and Plan:  1.  CAD status post DES to proximal LAD in 2017.  Follow-up Lexiscan  Myoview  in April 2024 showed an anterior/apical defect in the setting of breast attenuation, not exclusive of ischemia, LVEF vigorous at 76%.  She reports no angina or interval nitroglycerin  use, refill provided for  fresh bottle.  ECG reviewed and stable.  Continue medical therapy including aspirin  81 mg daily, Lipitor  80 mg daily, Imdur  30 mg daily, and as needed nitroglycerin .   2.  Mixed hyperlipidemia, on Lipitor  80 mg daily.  LDL 70 in February 2024.  No changes were made today.   3.  Primary hypertension.  She continues to track blood pressure at home, continue present regimen including Lopressor  25 mg 1/2 tablet twice daily and lisinopril  10 mg daily.  Disposition:  Follow up 6 months.  Signed, Gerard Knight, M.D., F.A.C.C. [facility]

## 2024-06-13 ENCOUNTER — Other Ambulatory Visit: Payer: Self-pay | Admitting: Cardiology

## 2024-12-18 ENCOUNTER — Other Ambulatory Visit: Payer: Self-pay | Admitting: Cardiology

## 2025-02-02 ENCOUNTER — Encounter: Payer: Self-pay | Admitting: *Deleted

## 2025-02-02 NOTE — Progress Notes (Signed)
 ELLORIE KINDALL                                          MRN: [id number]   02/02/2025   The [facility] Quality Team Specialist reviewed this patient medical record for the purposes of chart review for care gap closure. The following were reviewed: abstraction for care gap closure-glycemic status assessment.    [facility] Quality Team
